# Patient Record
Sex: Female | Born: 1985 | Race: White | Hispanic: No | Smoking: Never smoker
Health system: Southern US, Community
[De-identification: ages and names within clinical notes are randomized; demographics above are authoritative.]

## PROBLEM LIST (undated history)

## (undated) ENCOUNTER — Inpatient Hospital Stay (HOSPITAL_COMMUNITY): Payer: Self-pay

## (undated) DIAGNOSIS — Z789 Other specified health status: Secondary | ICD-10-CM

---
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
## 2010-03-17 ENCOUNTER — Inpatient Hospital Stay (HOSPITAL_COMMUNITY)
Admission: AD | Admit: 2010-03-17 | Discharge: 2010-03-21 | DRG: 371 | Disposition: A | Discharge status: Home / Self Care | Payer: BC Managed Care – PPO | Source: Ambulatory Visit | Attending: Obstetrics and Gynecology | Admitting: Obstetrics and Gynecology

## 2010-03-17 DIAGNOSIS — O4290 Premature rupture of membranes, unspecified as to length of time between rupture and onset of labor, unspecified weeks of gestation: Secondary | ICD-10-CM | POA: Diagnosis present

## 2010-03-17 DIAGNOSIS — O411090 Infection of amniotic sac and membranes, unspecified, unspecified trimester, not applicable or unspecified: Secondary | ICD-10-CM | POA: Diagnosis present

## 2010-03-17 LAB — CBC
Hemoglobin: 13.3 g/dL (ref 12.0–15.0)
MCH: 29.6 pg (ref 26.0–34.0)
RBC: 4.49 MIL/uL (ref 3.87–5.11)
WBC: 9.4 10*3/uL (ref 4.0–10.5)

## 2010-03-18 ENCOUNTER — Other Ambulatory Visit: Payer: Self-pay | Admitting: Obstetrics and Gynecology

## 2010-03-18 LAB — BASIC METABOLIC PANEL
BUN: 4 mg/dL — ABNORMAL LOW (ref 6–23)
CO2: 22 mEq/L (ref 19–32)
Chloride: 102 mEq/L (ref 96–112)
Creatinine, Ser: 0.7 mg/dL (ref 0.4–1.2)
Glucose, Bld: 101 mg/dL — ABNORMAL HIGH (ref 70–99)
Potassium: 4 mEq/L (ref 3.5–5.1)

## 2010-03-18 LAB — DIFFERENTIAL
Eosinophils Absolute: 0 10*3/uL (ref 0.0–0.7)
Lymphocytes Relative: 3 % — ABNORMAL LOW (ref 12–46)
Lymphs Abs: 0.5 10*3/uL — ABNORMAL LOW (ref 0.7–4.0)
Monocytes Relative: 8 % (ref 3–12)
Neutrophils Relative %: 89 % — ABNORMAL HIGH (ref 43–77)

## 2010-03-18 LAB — CBC
HCT: 35.8 % — ABNORMAL LOW (ref 36.0–46.0)
Hemoglobin: 11.9 g/dL — ABNORMAL LOW (ref 12.0–15.0)
MCH: 29.5 pg (ref 26.0–34.0)
MCV: 88.8 fL (ref 78.0–100.0)
Platelets: 154 10*3/uL (ref 150–400)
RBC: 4.03 MIL/uL (ref 3.87–5.11)
WBC: 16.7 10*3/uL — ABNORMAL HIGH (ref 4.0–10.5)

## 2010-03-19 LAB — CBC
HCT: 28.6 % — ABNORMAL LOW (ref 36.0–46.0)
Hemoglobin: 9.5 g/dL — ABNORMAL LOW (ref 12.0–15.0)
MCHC: 33.2 g/dL (ref 30.0–36.0)
MCV: 90.2 fL (ref 78.0–100.0)
RDW: 13.5 % (ref 11.5–15.5)

## 2010-03-19 LAB — DIFFERENTIAL
Basophils Absolute: 0 10*3/uL (ref 0.0–0.1)
Basophils Relative: 0 % (ref 0–1)
Eosinophils Relative: 1 % (ref 0–5)
Monocytes Absolute: 0.9 10*3/uL (ref 0.1–1.0)
Monocytes Relative: 7 % (ref 3–12)
Neutro Abs: 11.1 10*3/uL — ABNORMAL HIGH (ref 1.7–7.7)

## 2010-04-03 NOTE — Discharge Summary (Signed)
[name], LADD               ACCOUNT NO.:  [id number]  MEDICAL RECORD NO.:  [id number]           PATIENT TYPE:  I  LOCATION:  9133                          FACILITY:  [facility]  PHYSICIAN:  Pieter Partridge, MD   DATE OF BIRTH:  Nov 01, 1985  DATE OF ADMISSION:  03/17/2010 DATE OF DISCHARGE:  03/21/2010                              DISCHARGE SUMMARY   ADMITTING DIAGNOSIS:  Pregnancy at 38 weeks with premature rupture of membranes.  PROCEDURES:  Induction of labor, primary C-section on March 18, 2010, IV antibiotics.  DISCHARGE DIAGNOSES:  Status post low transverse cesarean section, chorioamnionitis, and anemia.  HOSPITAL COURSE:  [name] was admitted on March 17, 2010, after she reported spontaneous rupture of membranes spontaneously without contractions on 8 a.m. on the day of discharge.  She was given Pitocin augmentation to induce the labor.  She had a protracted labor course. She had an IUPC was placed and contractions were adequate to subadequate.  However, she arrested on an 8-9 cm and a C-section was performed.  C-section was performed without complications.  Also, at that time, the patient did have prolonged rupture of membranes at 18 hours.  She received 1 dose of a ampicillin 2 g.  At the time the C-section was called approximately 3 hours later, she did have a temperature of 101.2.  She was given antibiotics preop and would continue after delivery.  On March 18, 2010, the patient underwent an uncomplicated primary low transverse cesarean section.  Baby was 8 pounds and 1 ounce and Apgars 8 and 9.  EBL was 700.  ROUTINE PROCEDURE:  Skin was closed with 4-0 Monocryl on a Keith and Dermabond was applied to the incision.  The patient received antibiotics for 24 hours postop.  She never had another fever.  Her white count immediately in recovery was 16.9.  Her postop course was routine.  She was advanced to a regular diet without complication.  Her pain  was controlled with Dilaudid.  She has severe nausea and vomiting with Percocet and codeine-derivative narcotics, so she was given Dilaudid which controlled her pain very well.  She ambulated without difficulty.  She did have constipation at that time. In-house she was given Colace for that.  Her incision was clean, dry, and intact.  Uterine fundus was appropriately tender incision.  On extremity, she had 1 to 2+ edema.  LABORATORY STUDIES:  Her preop hemoglobin was 13.  Postop hemoglobin was 9.5.  White count on March 19, 2010, was 12.7.  DISCHARGE INFORMATION:  The patient is discharged to home in stable condition.  She is to follow up at [facility] in 2weeks for wound check.  DISCHARGE DIET:  Regular.  ACTIVITY:  No heavy lifting and pelvic rest.  MEDICATIONS:  Motrin 800; Dilaudid 2 mg, 20 tablets; and iron and then she is discharged to home in improved condition.  Baby did have a circumcision prior to discharge.     Pieter Partridge, MD     EBV/MEDQ  D:  03/21/2010  T:  03/21/2010  Job:  [id number]  Electronically Signed by Geryl Rankins MD  on 04/03/2010 08:33:43 AM

## 2010-04-03 NOTE — Op Note (Signed)
[name], [name]               ACCOUNT NO.:  [id number]  MEDICAL RECORD NO.:  [id number]           PATIENT TYPE:  I  LOCATION:  9133                          [facility]ACILITY[facility]  [facility]  PHYSICIAN:  Pieter Partridge, MD   DATE OF BIRTH:  1985-04-06  DATE OF PROCEDURE:  03/18/2010 DATE OF DISCHARGE:                              OPERATIVE REPORT   PREOPERATIVE DIAGNOSES: 1. Pregnancy at 38 and 2/7th weeks. 2. Arrest of dilatation. 3. Fever with presumed chorioamnionitis. 4. Prolonged rupture of membranes.  POSTOPERATIVE DIAGNOSES: 1. Pregnancy at 38 and 2/7th weeks. 2. Arrest of dilatation. 3. Fever with presumed chorioamnionitis. 4. Prolonged rupture of membranes.  SURGERY:  Primary low transverse cesarean section.  SURGEON:  Pieter Partridge, MD  ASSISTANT:  Technician.  ANESTHESIA:  Epidural.  ESTIMATED BLOOD LOSS:  700.  URINE OUTPUT:  100 of concentrating clear urine.  IV FLUIDS:  2800.  FINDINGS:  Viable female, no nuchal cord, Apgars 8 and 9, weight is 8 pounds 1 ounce, suspected transverse position.  SPECIMEN:  Placenta.  COMPLICATIONS:  None.  Sent to PACU in stable condition.  INDICATIONS:  [name] is a 25 year old gravida 2, para 0-0-1-0, who presented the day before surgery at 8 o'clock a.m. with premature rupture of membranes.  Cervical exam upon arrival was noted 1-2, 50, and -2.  She was not contracting.  She was started on low-dose Pitocin and progressed in active labor approximately 10 hours later.  An IUPC was placed because of the poor progression.  Pitocin continued to be adjusted, but her labor was protracted.  MVUs initially were less than 180 but once then they came up to 180, the patient continued to slowly progress approximately 1 cm every 1-1/2-2 hours.  The patient finally arrested at 8-9 cm,  0 station, and also at the time of C-section, recorded temperature was 101.  She had received ampicillin 2 g 3 hours prior to her fever.  Due to arrest  of dilatation, the patient was counseled on need for primary low transverse cesarean section.  Consent was obtained.  PROCEDURE IN DETAIL:  [name] was taken to the operating room with IV running.  She had epidural anesthesia in place.  She was prepped and draped in a normal sterile fashion.  After adequate anesthesia was confirmed, a Pfannenstiel skin incision was made 2 cm above the symphysis pubis with the scalpel and carried down to the underlying layer of fascia with the Bovie.  The patient had approximately 3 cm of subcutaneous tissues, subcutaneous adipose.  The fascia was then incised at the midline with the Bovie and the incision was extended laterally with the curved Mayo scissors.  The upper leaf of the fascia was then grasped with the Kocher clamps x2 and the rectus muscles were dissected sharply off the fascia.  Same was done below. The muscles were separated at the midline manually.  Peritoneum was identified and tented up and entered sharply with the Metzenbaum scissors.  A little adhesion was lysed with the Metzenbaum.  Alexis retractor was placed without difficulty.  Lower uterine segment was identified.  Bladder flap  was made, incising the serosa with the Metzenbaum scissors.  A transverse incision was made on the lower uterine segment in the standard laterally with the bandage scissors.  The occiput was delivered easily to the incision.  Baby was delivered without complication.  No nuchal cord noted.  Nose and mouth suctioned, cord clamped x2 and cut. Cord blood obtained.  Baby was placed at the bedside to the awaiting NICU Team.  Placenta was removed manually and the uterus was cleared of all clots and debris.  Uterus was a little bit foggy.  Pitocin and massage was initiated.  We would later call for Methergine.  The hysterotomy incision was reapproximated with 0 Vicryl in a continuous running fashion.  On the right-hand side, it was noted that there was  an extension of the hysterotomy incision that was reapproximated with 0 chromic.  Attention was turned to the portion of the cervix, that was cut back up to the incision and it was not bleeding at all.  Second layer of suture was used for imbrication.  Cauterization as needed with the Bovie.  Surgicel was then applied to the lower uterine segment.  After copious irrigation of the abdominal cavity, the rectus muscles were reapproximated in 2 interrupted stitches.  Fascia was then reapproximated with 0 Vicryl in a continuous running fashion. Subcutaneous space was irrigated and reapproximated with 2-0 plain gut. Skin was reapproximated with 4-0 Vicryl on a Keith needle.  Dermabond was applied to the incision.  The patient tolerated the procedure well.  All instrument, sponge, and needle counts were correct x3.  She was taken to the PACU in stable condition.  She had received ampicillin approximately 3 hours before surgery.  She got an additional gentamicin and clindamycin at the time of surgery.  SCDs were in place.  She also received 650 mg of Tylenol per rectum prior to the procedure.     Pieter Partridge, MD     EBV/MEDQ  D:  03/18/2010  T:  03/18/2010  Job:  [id number]  Electronically Signed by Geryl Rankins MD on 04/03/2010 08:34:14 AM

## 2010-04-03 NOTE — H&P (Signed)
NAMEEILIDH, MARCANO               ACCOUNT NO.:  [id number]  MEDICAL RECORD NO.:  [id number]           PATIENT TYPE:  I  LOCATION:  9133                          [facility]  [facility]  PHYSICIAN:  Pieter Partridge, MD   DATE OF BIRTH:  1985/12/17  DATE OF ADMISSION:  03/17/2010 DATE OF DISCHARGE:                             HISTORY & PHYSICAL   ADMISSION DIAGNOSIS:  Pregnancy at 38+ weeks with premature rupture of membranes.  ANTICIPATED PROCEDURES:  Induction of labor.  [name] is a 25 year old gravida 2, para 0-0-1-0 at 23 weeks and 1 day of admission, estimated due date of March 31, 2010, which was revised by 6-week ultrasound.  On the day of admission at 8 o'clock in the morning, she spontaneously ruptured with clear fluid.  She did not have contractions before but left before ruptured membranes and got a little crampy afterwards.  She did not have any vaginal bleeding and the fetus was active.  She was admitted to labor and delivery after it was noted that she was grossly ruptured, copious clear fluid was noted by the RN. Her prenatal care has been uncomplicated.  Group B strep status is negative.  ALLERGIES:  SHELLFISH and SULFA.  SULFA gives her nausea and vomit. SHELLFISH gives her hives.  MEDICATION:  Metronidazole and prenatal vitamins.  PAST MEDICAL HISTORY:  A remote history of asthma, last attack was at age 25, also history of mitral valve prolapse.  PAST SURGICAL HISTORY:  Negative.  OB HISTORY:  Elective AB at 6 weeks, uncomplicated.  GYN HISTORY:  Positive for Chlamydia and at 19, denied having PID.  No abnormal Pap smears.  FAMILY HISTORY:  Remarkable for her father had lung disease.  Father with ulcers and reflux, and her sister has hypothyroidism which is not an autoimmune etiology.  SOCIAL HISTORY:  Negative for tobacco, alcohol, or drug use.  Her partner is involved.  She is a Sales executive.  PHYSICAL EXAMINATION:  VITAL SIGNS:  Temperature at  this time is 101.0. She originally was 23.6, blood pressure is within normal limits. GENERAL:  No acute distress, alert and oriented. ABDOMEN:  Gravid and nontender. EXTREMITIES: One plus edema.  Cervical exam 8 to 9, moulding.  The presenting vertex is at 0 station.  External monitoring, baby was beautifully reactive upon arrival in the last hour it has decreased reactivity but there are accelerations, and no decelerations. Contractions sinusoidal every 1-2 minutes.  Pitocin was increased up to 28 milliunits.  MVUs have hovered around 180.  LABORATORY STUDIES:  Hemoglobin was 13.  White count on admission was 9.4.  ASSESSMENT:  This is a 25 year old gravida 2, para 0-0-1-0 38-2/7 weeks now who had arrest of dilatation and now fever likely due to chorioamnionitis, prolonged rupture of membranes.  PLAN:  To proceed with primary low transverse C-section.  She received ampicillin 23 hours ago  She will receive gentin, clinda, in the operating room and then also Tylenol per rectum for fever and we will continue antibiotics 24 hours post delivery.  Procedure and indication was discussed with the patient.  Informed consent was obtained.  Pieter Partridge, MD     EBV/MEDQ  D:  03/18/2010  T:  03/18/2010  Job:  [id number]  Electronically Signed by Geryl Rankins MD on 04/03/2010 08:33:52 AM

## 2011-07-16 ENCOUNTER — Other Ambulatory Visit: Payer: Self-pay | Admitting: Obstetrics & Gynecology

## 2011-07-16 ENCOUNTER — Other Ambulatory Visit (HOSPITAL_COMMUNITY)
Admission: RE | Admit: 2011-07-16 | Discharge: 2011-07-16 | Disposition: A | Discharge status: Home / Self Care | Payer: Managed Care, Other (non HMO) | Source: Ambulatory Visit | Attending: Obstetrics & Gynecology | Admitting: Obstetrics & Gynecology

## 2011-07-16 DIAGNOSIS — Z124 Encounter for screening for malignant neoplasm of cervix: Secondary | ICD-10-CM | POA: Insufficient documentation

## 2011-07-16 DIAGNOSIS — Z113 Encounter for screening for infections with a predominantly sexual mode of transmission: Secondary | ICD-10-CM | POA: Insufficient documentation

## 2013-06-23 ENCOUNTER — Telehealth: Payer: Self-pay | Admitting: Genetic Counselor

## 2013-06-23 NOTE — Telephone Encounter (Signed)
CALLED PATIENT TO SCHEDULE GENETIC APPT NOT ABLE TO LEAVE MESSAGE.  °

## 2013-06-26 ENCOUNTER — Telehealth: Payer: Self-pay | Admitting: Genetic Counselor

## 2013-06-26 NOTE — Telephone Encounter (Signed)
called patient to schedule genetic appt not able to leave message

## 2014-02-26 ENCOUNTER — Inpatient Hospital Stay (HOSPITAL_COMMUNITY)
Admission: AD | Admit: 2014-02-26 | Discharge: 2014-02-26 | Disposition: A | Discharge status: Home / Self Care | Payer: BLUE CROSS/BLUE SHIELD | Source: Ambulatory Visit | Attending: Obstetrics and Gynecology | Admitting: Obstetrics and Gynecology

## 2014-02-26 ENCOUNTER — Inpatient Hospital Stay (HOSPITAL_COMMUNITY): Payer: BLUE CROSS/BLUE SHIELD

## 2014-02-26 ENCOUNTER — Encounter (HOSPITAL_COMMUNITY): Payer: Self-pay | Admitting: General Practice

## 2014-02-26 DIAGNOSIS — O200 Threatened abortion: Secondary | ICD-10-CM

## 2014-02-26 DIAGNOSIS — Z3A01 Less than 8 weeks gestation of pregnancy: Secondary | ICD-10-CM | POA: Diagnosis not present

## 2014-02-26 DIAGNOSIS — O209 Hemorrhage in early pregnancy, unspecified: Secondary | ICD-10-CM | POA: Diagnosis present

## 2014-02-26 HISTORY — DX: Other specified health status: Z78.9

## 2014-02-26 LAB — WET PREP, GENITAL
Clue Cells Wet Prep HPF POC: NONE SEEN
Trich, Wet Prep: NONE SEEN
YEAST WET PREP: NONE SEEN

## 2014-02-26 LAB — URINALYSIS, ROUTINE W REFLEX MICROSCOPIC
BILIRUBIN URINE: NEGATIVE
GLUCOSE, UA: NEGATIVE mg/dL
KETONES UR: NEGATIVE mg/dL
LEUKOCYTES UA: NEGATIVE
Nitrite: NEGATIVE
PH: 7 (ref 5.0–8.0)
PROTEIN: NEGATIVE mg/dL
SPECIFIC GRAVITY, URINE: 1.015 (ref 1.005–1.030)
UROBILINOGEN UA: 0.2 mg/dL (ref 0.0–1.0)

## 2014-02-26 LAB — CBC WITH DIFFERENTIAL/PLATELET
BASOS ABS: 0 10*3/uL (ref 0.0–0.1)
Basophils Relative: 0 % (ref 0–1)
EOS ABS: 0 10*3/uL (ref 0.0–0.7)
EOS PCT: 0 % (ref 0–5)
HCT: 39 % (ref 36.0–46.0)
Hemoglobin: 13.5 g/dL (ref 12.0–15.0)
LYMPHS ABS: 1.3 10*3/uL (ref 0.7–4.0)
LYMPHS PCT: 15 % (ref 12–46)
MCH: 30.4 pg (ref 26.0–34.0)
MCHC: 34.6 g/dL (ref 30.0–36.0)
MCV: 87.8 fL (ref 78.0–100.0)
Monocytes Absolute: 0.5 10*3/uL (ref 0.1–1.0)
Monocytes Relative: 5 % (ref 3–12)
Neutro Abs: 7.1 10*3/uL (ref 1.7–7.7)
Neutrophils Relative %: 80 % — ABNORMAL HIGH (ref 43–77)
PLATELETS: 231 10*3/uL (ref 150–400)
RBC: 4.44 MIL/uL (ref 3.87–5.11)
RDW: 12.3 % (ref 11.5–15.5)
WBC: 8.9 10*3/uL (ref 4.0–10.5)

## 2014-02-26 LAB — URINE MICROSCOPIC-ADD ON: WBC UA: NONE SEEN WBC/hpf (ref <3)

## 2014-02-26 LAB — HCG, QUANTITATIVE, PREGNANCY: hCG, Beta Chain, Quant, S: 29001 m[IU]/mL — ABNORMAL HIGH (ref <5)

## 2014-02-26 LAB — POCT PREGNANCY, URINE: Preg Test, Ur: POSITIVE — AB

## 2014-02-26 NOTE — MAU Note (Signed)
Has not been seen yet with preg, has first appt scheduled

## 2014-02-26 NOTE — MAU Provider Note (Signed)
History     CSN: [id number]  Arrival date and time: 02/26/14 1156   First Provider Initiated Contact with Patient 02/26/14 1337      Chief Complaint  Patient presents with  . Vaginal Bleeding   HPI [name] is 29 y.o. G2P0101 [redacted]w[redacted]d weeks presenting with vaginal bleeding in first trimester pregnancy.  She is a patient of Dr. Dion Body, who delivered her first baby.  She has appt Thursday.  LMP 01/15/14.  She began bleeding morning that followed a mild cramp.  "watery blood at first, then it stopped.  30 minutes later bleeding again dark red".  Denies cramping since.  Had intercourse last night.   Past Medical History  Diagnosis Date  . Medical history non-contributory     Past Surgical History  Procedure Laterality Date  . Cesarean section      History reviewed. No pertinent family history.  History  Substance Use Topics  . Smoking status: Never Smoker   . Smokeless tobacco: Never Used  . Alcohol Use: No     Comment: socially before pregnancy    Allergies:  Allergies  Allergen Reactions  . Shellfish Allergy Hives  . Sulfa Antibiotics Hives    Prescriptions prior to admission  Medication Sig Dispense Refill Last Dose  . Prenatal Vit-Fe Fumarate-FA (PRENATAL MULTIVITAMIN) TABS tablet Take 1 tablet by mouth daily at 12 noon.   02/25/2014 at Unknown time    Review of Systems  Constitutional: Negative for fever and chills.  Gastrointestinal: Negative for nausea, vomiting and abdominal pain.  Genitourinary: Negative for dysuria, urgency, frequency and hematuria.       + vaginal bleeding  Neurological: Negative for headaches.   Physical Exam   Blood pressure 132/90, pulse 85, temperature 98.2 F (36.8 C), temperature source Oral, resp. rate 18, height 5' 1.5" (1.562 m), weight 170 lb (77.111 kg), last menstrual period 01/15/2014.  Physical Exam  Constitutional: She appears well-developed and well-nourished. No distress.  HENT:  Head: Normocephalic.  Neck:  Normal range of motion.  Cardiovascular: Normal rate.   Respiratory: Effort normal.  GI: Soft. She exhibits no distension and no mass. There is no tenderness. There is no rebound and no guarding.  Genitourinary: There is no rash, tenderness or lesion on the right labia. There is no rash, tenderness or lesion on the left labia. Uterus is not tender. Cervix exhibits no motion tenderness, no discharge and no friability. Right adnexum displays no mass, no tenderness and no fullness. Left adnexum displays no mass, no tenderness and no fullness. There is bleeding (small amount of dark stringy blood without clot. ) in the vagina. No erythema in the vagina. No vaginal discharge found.  Skin: Skin is warm and dry.  Psychiatric: She has a normal mood and affect. Her behavior is normal.   Results for orders placed or performed during the hospital encounter of 02/26/14 (from the past 24 hour(s))  Urinalysis, Routine w reflex microscopic     Status: Abnormal   Collection Time: 02/26/14 12:15 PM  Result Value Ref Range   Color, Urine YELLOW YELLOW   APPearance CLEAR CLEAR   Specific Gravity, Urine 1.015 1.005 - 1.030   pH 7.0 5.0 - 8.0   Glucose, UA NEGATIVE NEGATIVE mg/dL   Hgb urine dipstick LARGE (A) NEGATIVE   Bilirubin Urine NEGATIVE NEGATIVE   Ketones, ur NEGATIVE NEGATIVE mg/dL   Protein, ur NEGATIVE NEGATIVE mg/dL   Urobilinogen, UA 0.2 0.0 - 1.0 mg/dL   Nitrite NEGATIVE NEGATIVE  Leukocytes, UA NEGATIVE NEGATIVE  Urine microscopic-add on     Status: Abnormal   Collection Time: 02/26/14 12:15 PM  Result Value Ref Range   Squamous Epithelial / LPF RARE RARE   WBC, UA  <3 WBC/hpf    NO FORMED ELEMENTS SEEN ON URINE MICROSCOPIC EXAMINATION   RBC / HPF 7-10 <3 RBC/hpf   Bacteria, UA FEW (A) RARE   Urine-Other MUCOUS PRESENT   Pregnancy, urine POC     Status: Abnormal   Collection Time: 02/26/14 12:32 PM  Result Value Ref Range   Preg Test, Ur POSITIVE (A) NEGATIVE  CBC with Differential      Status: Abnormal   Collection Time: 02/26/14  2:12 PM  Result Value Ref Range   WBC 8.9 4.0 - 10.5 K/uL   RBC 4.44 3.87 - 5.11 MIL/uL   Hemoglobin 13.5 12.0 - 15.0 g/dL   HCT 16.1 09.6 - 04.5 %   MCV 87.8 78.0 - 100.0 fL   MCH 30.4 26.0 - 34.0 pg   MCHC 34.6 30.0 - 36.0 g/dL   RDW 40.9 81.1 - 91.4 %   Platelets 231 150 - 400 K/uL   Neutrophils Relative % 80 (H) 43 - 77 %   Neutro Abs 7.1 1.7 - 7.7 K/uL   Lymphocytes Relative 15 12 - 46 %   Lymphs Abs 1.3 0.7 - 4.0 K/uL   Monocytes Relative 5 3 - 12 %   Monocytes Absolute 0.5 0.1 - 1.0 K/uL   Eosinophils Relative 0 0 - 5 %   Eosinophils Absolute 0.0 0.0 - 0.7 K/uL   Basophils Relative 0 0 - 1 %   Basophils Absolute 0.0 0.0 - 0.1 K/uL  hCG, quantitative, pregnancy     Status: Abnormal   Collection Time: 02/26/14  2:12 PM  Result Value Ref Range   hCG, Beta Chain, Quant, S 29001 (H) <5 mIU/mL  Wet prep, genital     Status: Abnormal   Collection Time: 02/26/14  3:39 PM  Result Value Ref Range   Yeast Wet Prep HPF POC NONE SEEN NONE SEEN   Trich, Wet Prep NONE SEEN NONE SEEN   Clue Cells Wet Prep HPF POC NONE SEEN NONE SEEN   WBC, Wet Prep HPF POC FEW (A) NONE SEEN   BLOOD TYPE FROM PREVIOUS RECORD IS B POSITIVE      CLINICAL DATA: Bleeding and cramping  EXAM: OBSTETRIC <14 WK [place] AND TRANSVAGINAL OB US  TECHNIQUE: Both transabdominal and transvaginal ultrasound examinations were performed for complete evaluation of the gestation as well as the maternal uterus, adnexal regions, and pelvic cul-de-sac. Transvaginal technique was performed to assess early pregnancy.  COMPARISON: None.  FINDINGS: Intrauterine gestational sac: Single  Yolk sac: Yes  Embryo: Yes  Cardiac Activity: No  Heart Rate: Not visualized bpm  CRL: 3.2 mm 6 w 0 d [place] EDC: 10/22/2014  Maternal uterus/adnexae:  Subchorionic hemorrhage: Small  Right ovary: Normal  Left ovary: Normal  Other  :None  Free fluid: None  IMPRESSION: 1. Single intrauterine gestational sac containing an embryo and yolk sac. No cardiac activity identified within the embryo. Findings are suspicious but not yet definitive for failed pregnancy. Recommend follow-up US in 10-14 days for definitive diagnosis. This recommendation follows SRU consensus guidelines: Diagnostic Criteria for Nonviable Pregnancy Early in the First Trimester. Malva Limes Med 2013; 782:9562-13.   Electronically Signed  By: Signa Kell M.D.  On: 02/26/2014 15:29       MAU Course  Procedures  GC/CHL culture pending  MDM Discussed patients HPI, labs and U/S findings with Dr. Richardson Doppole.  Instructed to tell patient to keep appt for this week in the office and they will arrange to repeat U/S in 7-10 days. Discussed threatened miscarriage with the patient  Assessment and Plan  A:  Vaginal bleeding in first trimester pregancy       Threatened miscarriage  P:  Discussed findings with the patient      Instructed her to call Dr. Levora AngelVanardo with worsening sxs      Keep scheduled appointment  Gerda Yin,EVE M 02/26/2014, 4:14 PM

## 2014-02-26 NOTE — Discharge Instructions (Signed)
Threatened Miscarriage °A threatened miscarriage occurs when you have vaginal bleeding during your first 20 weeks of pregnancy but the pregnancy has not ended. If you have vaginal bleeding during this time, your health care provider will do tests to make sure you are still pregnant. If the tests show you are still pregnant and the developing baby (fetus) inside your womb (uterus) is still growing, your condition is considered a threatened miscarriage. °A threatened miscarriage does not mean your pregnancy will end, but it does increase the risk of losing your pregnancy (complete miscarriage). °CAUSES  °The cause of a threatened miscarriage is usually not known. If you go on to have a complete miscarriage, the most common cause is an abnormal number of chromosomes in the developing baby. Chromosomes are the structures inside cells that hold all your genetic material. °Some causes of vaginal bleeding that do not result in miscarriage include: °· Having sex. °· Having an infection. °· Normal hormone changes of pregnancy. °· Bleeding that occurs when an egg implants in your uterus. °RISK FACTORS °Risk factors for bleeding in early pregnancy include: °· Obesity. °· Smoking. °· Drinking excessive amounts of alcohol or caffeine. °· Recreational drug use. °SIGNS AND SYMPTOMS °· Light vaginal bleeding. °· Mild abdominal pain or cramps. °DIAGNOSIS  °If you have bleeding with or without abdominal pain before 20 weeks of pregnancy, your health care provider will do tests to check whether you are still pregnant. One important test involves using sound waves and a computer (ultrasound) to create images of the inside of your uterus. Other tests include an internal exam of your vagina and uterus (pelvic exam) and measurement of your baby's heart rate.  °You may be diagnosed with a threatened miscarriage if: °· Ultrasound testing shows you are still pregnant. °· Your baby's heart rate is strong. °· A pelvic exam shows that the  opening between your uterus and your vagina (cervix) is closed. °· Your heart rate and blood pressure are stable. °· Blood tests confirm you are still pregnant. °TREATMENT  °No treatments have been shown to prevent a threatened miscarriage from going on to a complete miscarriage. However, the right home care is important.  °HOME CARE INSTRUCTIONS  °· Make sure you keep all your appointments for prenatal care. This is very important. °· Get plenty of rest. °· Do not have sex or use tampons if you have vaginal bleeding. °· Do not douche. °· Do not smoke or use recreational drugs. °· Do not drink alcohol. °· Avoid caffeine. °SEEK MEDICAL CARE IF: °· You have light vaginal bleeding or spotting while pregnant. °· You have abdominal pain or cramping. °· You have a fever. °SEEK IMMEDIATE MEDICAL CARE IF: °· You have heavy vaginal bleeding. °· You have blood clots coming from your vagina. °· You have severe low back pain or abdominal cramps. °· You have fever, chills, and severe abdominal pain. °MAKE SURE YOU: °· Understand these instructions. °· Will watch your condition. °· Will get help right away if you are not doing well or get worse. °Document Released: 01/26/2005 Document Revised: 01/31/2013 Document Reviewed: 11/22/2012 °ExitCare® Patient Information ©2015 [employer]. This information is not intended to replace advice given to you by your health care provider. Make sure you discuss any questions you have with your health care provider. ° °Vaginal Bleeding During Pregnancy, First Trimester °A small amount of bleeding (spotting) from the vagina is relatively common in early pregnancy. It usually stops on its own. Various things may cause bleeding   or spotting in early pregnancy. Some bleeding may be related to the pregnancy, and some may not. In most cases, the bleeding is normal and is not a problem. However, bleeding can also be a sign of something serious. Be sure to tell your health care provider about any  vaginal bleeding right away. °Some possible causes of vaginal bleeding during the first trimester include: °· Infection or inflammation of the cervix. °· Growths (polyps) on the cervix. °· Miscarriage or threatened miscarriage. °· Pregnancy tissue has developed outside of the uterus and in a fallopian tube (tubal pregnancy). °· Tiny cysts have developed in the uterus instead of pregnancy tissue (molar pregnancy). °HOME CARE INSTRUCTIONS  °Watch your condition for any changes. The following actions may help to lessen any discomfort you are feeling: °· Follow your health care provider's instructions for limiting your activity. If your health care provider orders bed rest, you may need to stay in bed and only get up to use the bathroom. However, your health care provider may allow you to continue light activity. °· If needed, make plans for someone to help with your regular activities and responsibilities while you are on bed rest. °· Keep track of the number of pads you use each day, how often you change pads, and how soaked (saturated) they are. Write this down. °· Do not use tampons. Do not douche. °· Do not have sexual intercourse or orgasms until approved by your health care provider. °· If you pass any tissue from your vagina, save the tissue so you can show it to your health care provider. °· Only take over-the-counter or prescription medicines as directed by your health care provider. °· Do not take aspirin because it can make you bleed. °· Keep all follow-up appointments as directed by your health care provider. °SEEK MEDICAL CARE IF: °· You have any vaginal bleeding during any part of your pregnancy. °· You have cramps or labor pains. °· You have a fever, not controlled by medicine. °SEEK IMMEDIATE MEDICAL CARE IF:  °· You have severe cramps in your back or belly (abdomen). °· You pass large clots or tissue from your vagina. °· Your bleeding increases. °· You feel light-headed or weak, or you have fainting  episodes. °· You have chills. °· You are leaking fluid or have a gush of fluid from your vagina. °· You pass out while having a bowel movement. °MAKE SURE YOU: °· Understand these instructions. °· Will watch your condition. °· Will get help right away if you are not doing well or get worse. °Document Released: 11/05/2004 Document Revised: 01/31/2013 Document Reviewed: 10/03/2012 °ExitCare® Patient Information ©2015 [employer]. This information is not intended to replace advice given to you by your health care provider. Make sure you discuss any questions you have with your health care provider. ° °

## 2014-02-26 NOTE — MAU Note (Signed)
Started bleeding around 1030 this morninig.  Is bleeding more now, but cramping less.

## 2014-02-27 LAB — GC/CHLAMYDIA PROBE AMP (~~LOC~~) NOT AT ARMC
CHLAMYDIA, DNA PROBE: NEGATIVE
NEISSERIA GONORRHEA: NEGATIVE

## 2014-03-29 ENCOUNTER — Other Ambulatory Visit: Payer: Self-pay | Admitting: Obstetrics and Gynecology

## 2014-03-29 ENCOUNTER — Other Ambulatory Visit (HOSPITAL_COMMUNITY)
Admission: RE | Admit: 2014-03-29 | Discharge: 2014-03-29 | Disposition: A | Discharge status: Home / Self Care | Payer: BLUE CROSS/BLUE SHIELD | Source: Ambulatory Visit | Attending: Obstetrics and Gynecology | Admitting: Obstetrics and Gynecology

## 2014-03-29 DIAGNOSIS — Z01411 Encounter for gynecological examination (general) (routine) with abnormal findings: Secondary | ICD-10-CM | POA: Diagnosis present

## 2014-03-29 DIAGNOSIS — Z1151 Encounter for screening for human papillomavirus (HPV): Secondary | ICD-10-CM | POA: Diagnosis present

## 2014-03-29 DIAGNOSIS — R87810 Cervical high risk human papillomavirus (HPV) DNA test positive: Secondary | ICD-10-CM | POA: Insufficient documentation

## 2014-04-02 LAB — CYTOLOGY - PAP

## 2014-05-22 ENCOUNTER — Encounter (HOSPITAL_COMMUNITY): Payer: Self-pay

## 2014-10-12 ENCOUNTER — Encounter (HOSPITAL_COMMUNITY)
Admission: RE | Admit: 2014-10-12 | Discharge: 2014-10-12 | Disposition: A | Discharge status: Home / Self Care | Payer: Medicaid Other | Source: Ambulatory Visit | Attending: Obstetrics and Gynecology | Admitting: Obstetrics and Gynecology

## 2014-10-12 ENCOUNTER — Encounter (HOSPITAL_COMMUNITY): Payer: Self-pay

## 2014-10-12 DIAGNOSIS — O3421 Maternal care for scar from previous cesarean delivery: Secondary | ICD-10-CM | POA: Diagnosis not present

## 2014-10-12 DIAGNOSIS — Z01818 Encounter for other preprocedural examination: Secondary | ICD-10-CM | POA: Diagnosis not present

## 2014-10-12 LAB — TYPE AND SCREEN
ABO/RH(D): B POS
Antibody Screen: NEGATIVE

## 2014-10-12 LAB — CBC
HCT: 31.8 % — ABNORMAL LOW (ref 36.0–46.0)
HEMOGLOBIN: 10.7 g/dL — AB (ref 12.0–15.0)
MCH: 29.8 pg (ref 26.0–34.0)
MCHC: 33.6 g/dL (ref 30.0–36.0)
MCV: 88.6 fL (ref 78.0–100.0)
PLATELETS: 143 10*3/uL — AB (ref 150–400)
RBC: 3.59 MIL/uL — AB (ref 3.87–5.11)
RDW: 13.5 % (ref 11.5–15.5)
WBC: 7.3 10*3/uL (ref 4.0–10.5)

## 2014-10-12 NOTE — Patient Instructions (Addendum)
   Your procedure is scheduled on: SEPT 6 AT 730AM   [facility] through [facility]  Pick up the phone at the desk and dial [phone] and inform us of your arrival.  Please call this number if you have any problems the morning of surgery: [phone]  Remember: DO NOT EAT OR DRINK LIQUIDS AFTER MIDNIGHT SEPT 5 (MONDAY)     Do not wear jewelry, make-up, or FINGER nail polish No metal in your hair or on your body. Do not wear lotions, powders, perfumes.  You may wear deodorant.  Do not bring valuables to the hospital. Contacts, dentures or bridgework may not be worn into surgery.  Leave suitcase in the car. After Surgery it may be brought to your room. For patients being admitted to the hospital, checkout time is 11:00am the day of discharge.

## 2014-10-13 LAB — RPR: RPR Ser Ql: NONREACTIVE

## 2014-10-16 ENCOUNTER — Inpatient Hospital Stay (HOSPITAL_COMMUNITY)
Admission: RE | Admit: 2014-10-16 | Discharge: 2014-10-18 | DRG: 766 | Disposition: A | Discharge status: Home / Self Care | Payer: 59 | Source: Ambulatory Visit | Attending: Obstetrics and Gynecology | Admitting: Obstetrics and Gynecology

## 2014-10-16 ENCOUNTER — Encounter (HOSPITAL_COMMUNITY): Payer: Self-pay

## 2014-10-16 ENCOUNTER — Inpatient Hospital Stay (HOSPITAL_COMMUNITY): Payer: 59 | Admitting: Certified Registered Nurse Anesthetist

## 2014-10-16 ENCOUNTER — Encounter (HOSPITAL_COMMUNITY)
Admission: AD | Disposition: A | Discharge status: Home / Self Care | Payer: Self-pay | Source: Ambulatory Visit | Attending: Obstetrics and Gynecology

## 2014-10-16 DIAGNOSIS — Z720 Tobacco use: Secondary | ICD-10-CM

## 2014-10-16 DIAGNOSIS — Z3A39 39 weeks gestation of pregnancy: Secondary | ICD-10-CM | POA: Diagnosis present

## 2014-10-16 DIAGNOSIS — Z98891 History of uterine scar from previous surgery: Secondary | ICD-10-CM

## 2014-10-16 DIAGNOSIS — O3421 Maternal care for scar from previous cesarean delivery: Principal | ICD-10-CM | POA: Diagnosis present

## 2014-10-16 LAB — TYPE AND SCREEN
ABO/RH(D): B POS
Antibody Screen: NEGATIVE

## 2014-10-16 SURGERY — Surgical Case
Anesthesia: Spinal

## 2014-10-16 MED ORDER — MEPERIDINE HCL 25 MG/ML IJ SOLN: 6.2500 mg | INTRAMUSCULAR | Status: DC | PRN

## 2014-10-16 MED ORDER — CEFAZOLIN SODIUM-DEXTROSE 2-3 GM-% IV SOLR
2.0000 g | INTRAVENOUS | Status: AC
  Administered 2014-10-16: 2 g via INTRAVENOUS

## 2014-10-16 MED ORDER — LACTATED RINGERS IV SOLN
INTRAVENOUS | Status: DC
  Administered 2014-10-16 (×2): via INTRAVENOUS

## 2014-10-16 MED ORDER — HYDROMORPHONE HCL 1 MG/ML IJ SOLN: 0.2500 mg | INTRAMUSCULAR | Status: DC | PRN

## 2014-10-16 MED ORDER — SCOPOLAMINE 1 MG/3DAYS TD PT72: 1.0000 | MEDICATED_PATCH | Freq: Once | TRANSDERMAL | Status: DC

## 2014-10-16 MED ORDER — DIPHENHYDRAMINE HCL 25 MG PO CAPS: 25.0000 mg | ORAL_CAPSULE | Freq: Four times a day (QID) | ORAL | Status: DC | PRN

## 2014-10-16 MED ORDER — ONDANSETRON HCL 4 MG/2ML IJ SOLN: 4.0000 mg | Freq: Three times a day (TID) | INTRAMUSCULAR | Status: DC | PRN

## 2014-10-16 MED ORDER — DIPHENHYDRAMINE HCL 50 MG/ML IJ SOLN
12.5000 mg | INTRAMUSCULAR | Status: DC | PRN
  Administered 2014-10-16: 12.5 mg via INTRAVENOUS
  Filled 2014-10-16: qty 1

## 2014-10-16 MED ORDER — LACTATED RINGERS IV SOLN: INTRAVENOUS | Status: DC

## 2014-10-16 MED ORDER — IBUPROFEN 600 MG PO TABS
600.0000 mg | ORAL_TABLET | Freq: Four times a day (QID) | ORAL | Status: DC
  Administered 2014-10-16 – 2014-10-18 (×8): 600 mg via ORAL
  Filled 2014-10-16 (×8): qty 1

## 2014-10-16 MED ORDER — ZOLPIDEM TARTRATE 5 MG PO TABS: 5.0000 mg | ORAL_TABLET | Freq: Every evening | ORAL | Status: DC | PRN

## 2014-10-16 MED ORDER — NALBUPHINE HCL 10 MG/ML IJ SOLN
5.0000 mg | Freq: Once | INTRAMUSCULAR | Status: DC | PRN
  Filled 2014-10-16: qty 0.5

## 2014-10-16 MED ORDER — PHENYLEPHRINE 8 MG IN D5W 100 ML (0.08MG/ML) PREMIX OPTIME
INJECTION | INTRAVENOUS | Status: AC
  Filled 2014-10-16: qty 100

## 2014-10-16 MED ORDER — NALOXONE HCL 1 MG/ML IJ SOLN
1.0000 ug/kg/h | INTRAVENOUS | Status: DC | PRN
  Filled 2014-10-16: qty 2

## 2014-10-16 MED ORDER — METHYLERGONOVINE MALEATE 0.2 MG PO TABS: 0.2000 mg | ORAL_TABLET | ORAL | Status: DC | PRN

## 2014-10-16 MED ORDER — INFLUENZA VAC SPLIT QUAD 0.5 ML IM SUSY
0.5000 mL | PREFILLED_SYRINGE | INTRAMUSCULAR | Status: AC
  Administered 2014-10-17: 0.5 mL via INTRAMUSCULAR

## 2014-10-16 MED ORDER — NALOXONE HCL 0.4 MG/ML IJ SOLN: 0.4000 mg | INTRAMUSCULAR | Status: DC | PRN

## 2014-10-16 MED ORDER — LACTATED RINGERS IV SOLN
INTRAVENOUS | Status: DC | PRN
  Administered 2014-10-16 (×3): via INTRAVENOUS

## 2014-10-16 MED ORDER — PROMETHAZINE HCL 25 MG/ML IJ SOLN: 6.2500 mg | INTRAMUSCULAR | Status: DC | PRN

## 2014-10-16 MED ORDER — DIBUCAINE 1 % RE OINT: 1.0000 "application " | TOPICAL_OINTMENT | RECTAL | Status: DC | PRN

## 2014-10-16 MED ORDER — ONDANSETRON HCL 4 MG/2ML IJ SOLN
INTRAMUSCULAR | Status: AC
  Filled 2014-10-16: qty 2

## 2014-10-16 MED ORDER — FENTANYL CITRATE (PF) 100 MCG/2ML IJ SOLN
INTRAMUSCULAR | Status: DC | PRN
  Administered 2014-10-16: 10 ug via INTRATHECAL

## 2014-10-16 MED ORDER — OXYTOCIN 10 UNIT/ML IJ SOLN
INTRAMUSCULAR | Status: AC
  Filled 2014-10-16: qty 4

## 2014-10-16 MED ORDER — MENTHOL 3 MG MT LOZG: 1.0000 | LOZENGE | OROMUCOSAL | Status: DC | PRN

## 2014-10-16 MED ORDER — LANOLIN HYDROUS EX OINT: 1.0000 "application " | TOPICAL_OINTMENT | CUTANEOUS | Status: DC | PRN

## 2014-10-16 MED ORDER — OXYTOCIN 40 UNITS IN LACTATED RINGERS INFUSION - SIMPLE MED
62.5000 mL/h | INTRAVENOUS | Status: AC
Start: 2014-10-16 — End: 2014-10-17

## 2014-10-16 MED ORDER — DEXAMETHASONE SODIUM PHOSPHATE 4 MG/ML IJ SOLN
INTRAMUSCULAR | Status: AC
  Filled 2014-10-16: qty 1

## 2014-10-16 MED ORDER — MORPHINE SULFATE 0.5 MG/ML IJ SOLN
INTRAMUSCULAR | Status: AC
  Filled 2014-10-16: qty 100

## 2014-10-16 MED ORDER — BUPIVACAINE IN DEXTROSE 0.75-8.25 % IT SOLN
INTRATHECAL | Status: DC | PRN
  Administered 2014-10-16: 1.4 mL via INTRATHECAL

## 2014-10-16 MED ORDER — METHYLERGONOVINE MALEATE 0.2 MG/ML IJ SOLN: 0.2000 mg | INTRAMUSCULAR | Status: DC | PRN

## 2014-10-16 MED ORDER — OXYTOCIN 10 UNIT/ML IJ SOLN
40.0000 [IU] | INTRAVENOUS | Status: DC | PRN
  Administered 2014-10-16: 40 [IU] via INTRAVENOUS

## 2014-10-16 MED ORDER — SIMETHICONE 80 MG PO CHEW
80.0000 mg | CHEWABLE_TABLET | ORAL | Status: DC
  Administered 2014-10-16 – 2014-10-17 (×2): 80 mg via ORAL
  Filled 2014-10-16 (×2): qty 1

## 2014-10-16 MED ORDER — FENTANYL CITRATE (PF) 100 MCG/2ML IJ SOLN
INTRAMUSCULAR | Status: AC
  Filled 2014-10-16: qty 4

## 2014-10-16 MED ORDER — SIMETHICONE 80 MG PO CHEW: 80.0000 mg | CHEWABLE_TABLET | ORAL | Status: DC | PRN

## 2014-10-16 MED ORDER — KETOROLAC TROMETHAMINE 30 MG/ML IJ SOLN: 30.0000 mg | Freq: Four times a day (QID) | INTRAMUSCULAR | Status: AC | PRN

## 2014-10-16 MED ORDER — NALBUPHINE HCL 10 MG/ML IJ SOLN
5.0000 mg | INTRAMUSCULAR | Status: DC | PRN
  Filled 2014-10-16: qty 0.5

## 2014-10-16 MED ORDER — LACTATED RINGERS IV SOLN
Freq: Once | INTRAVENOUS | Status: AC
  Administered 2014-10-16: 07:00:00 via INTRAVENOUS

## 2014-10-16 MED ORDER — MEPERIDINE HCL 25 MG/ML IJ SOLN
INTRAMUSCULAR | Status: AC
  Filled 2014-10-16: qty 1

## 2014-10-16 MED ORDER — LACTATED RINGERS IV SOLN
INTRAVENOUS | Status: DC | PRN
  Administered 2014-10-16: 08:00:00 via INTRAVENOUS

## 2014-10-16 MED ORDER — MORPHINE SULFATE (PF) 0.5 MG/ML IJ SOLN
INTRAMUSCULAR | Status: DC | PRN
  Administered 2014-10-16: .2 mg via INTRATHECAL

## 2014-10-16 MED ORDER — SCOPOLAMINE 1 MG/3DAYS TD PT72
MEDICATED_PATCH | TRANSDERMAL | Status: AC
  Filled 2014-10-16: qty 1

## 2014-10-16 MED ORDER — OXYCODONE-ACETAMINOPHEN 5-325 MG PO TABS: 1.0000 | ORAL_TABLET | ORAL | Status: DC | PRN

## 2014-10-16 MED ORDER — ACETAMINOPHEN 325 MG PO TABS
650.0000 mg | ORAL_TABLET | ORAL | Status: DC | PRN
  Administered 2014-10-17 – 2014-10-18 (×4): 650 mg via ORAL
  Filled 2014-10-16 (×4): qty 2

## 2014-10-16 MED ORDER — CEFAZOLIN SODIUM-DEXTROSE 2-3 GM-% IV SOLR
INTRAVENOUS | Status: AC
  Filled 2014-10-16: qty 50

## 2014-10-16 MED ORDER — DEXAMETHASONE SODIUM PHOSPHATE 4 MG/ML IJ SOLN
INTRAMUSCULAR | Status: DC | PRN
  Administered 2014-10-16: 4 mg via INTRAVENOUS

## 2014-10-16 MED ORDER — DIPHENHYDRAMINE HCL 25 MG PO CAPS
25.0000 mg | ORAL_CAPSULE | ORAL | Status: DC | PRN
  Administered 2014-10-17: 25 mg via ORAL
  Filled 2014-10-16 (×2): qty 1

## 2014-10-16 MED ORDER — OXYCODONE-ACETAMINOPHEN 5-325 MG PO TABS: 2.0000 | ORAL_TABLET | ORAL | Status: DC | PRN

## 2014-10-16 MED ORDER — TETANUS-DIPHTH-ACELL PERTUSSIS 5-2.5-18.5 LF-MCG/0.5 IM SUSP
0.5000 mL | Freq: Once | INTRAMUSCULAR | Status: AC
  Administered 2014-10-17: 0.5 mL via INTRAMUSCULAR
  Filled 2014-10-16: qty 0.5

## 2014-10-16 MED ORDER — SCOPOLAMINE 1 MG/3DAYS TD PT72
1.0000 | MEDICATED_PATCH | Freq: Once | TRANSDERMAL | Status: DC
  Administered 2014-10-16: 1.5 mg via TRANSDERMAL

## 2014-10-16 MED ORDER — ONDANSETRON HCL 4 MG/2ML IJ SOLN
INTRAMUSCULAR | Status: DC | PRN
  Administered 2014-10-16: 4 mg via INTRAVENOUS

## 2014-10-16 MED ORDER — SIMETHICONE 80 MG PO CHEW
80.0000 mg | CHEWABLE_TABLET | Freq: Three times a day (TID) | ORAL | Status: DC
  Administered 2014-10-16 – 2014-10-18 (×4): 80 mg via ORAL
  Filled 2014-10-16 (×5): qty 1

## 2014-10-16 MED ORDER — PRENATAL MULTIVITAMIN CH
1.0000 | ORAL_TABLET | Freq: Every day | ORAL | Status: DC
  Administered 2014-10-17 – 2014-10-18 (×2): 1 via ORAL
  Filled 2014-10-16 (×2): qty 1

## 2014-10-16 MED ORDER — SODIUM CHLORIDE 0.9 % IJ SOLN: 3.0000 mL | INTRAMUSCULAR | Status: DC | PRN

## 2014-10-16 MED ORDER — ACETAMINOPHEN 500 MG PO TABS
1000.0000 mg | ORAL_TABLET | Freq: Four times a day (QID) | ORAL | Status: AC
  Administered 2014-10-16 – 2014-10-17 (×4): 1000 mg via ORAL
  Filled 2014-10-16 (×4): qty 2

## 2014-10-16 MED ORDER — SENNOSIDES-DOCUSATE SODIUM 8.6-50 MG PO TABS
2.0000 | ORAL_TABLET | ORAL | Status: DC
  Administered 2014-10-16 – 2014-10-17 (×2): 2 via ORAL
  Filled 2014-10-16 (×2): qty 2

## 2014-10-16 MED ORDER — PHENYLEPHRINE 8 MG IN D5W 100 ML (0.08MG/ML) PREMIX OPTIME
INJECTION | INTRAVENOUS | Status: DC | PRN
  Administered 2014-10-16: 60 ug/min via INTRAVENOUS

## 2014-10-16 MED ORDER — WITCH HAZEL-GLYCERIN EX PADS: 1.0000 "application " | MEDICATED_PAD | CUTANEOUS | Status: DC | PRN

## 2014-10-16 SURGICAL SUPPLY — 39 items
BARRIER ADHS 3X4 INTERCEED (GAUZE/BANDAGES/DRESSINGS) ×6 IMPLANT
BENZOIN TINCTURE PRP APPL 2/3 (GAUZE/BANDAGES/DRESSINGS) ×3 IMPLANT
CLAMP CORD UMBIL (MISCELLANEOUS) IMPLANT
CLOSURE WOUND 1/2 X4 (GAUZE/BANDAGES/DRESSINGS) ×1
CLOTH BEACON ORANGE TIMEOUT ST (SAFETY) ×3 IMPLANT
DRAPE SHEET LG 3/4 BI-LAMINATE (DRAPES) IMPLANT
DRSG OPSITE POSTOP 4X10 (GAUZE/BANDAGES/DRESSINGS) ×3 IMPLANT
DURAPREP 26ML APPLICATOR (WOUND CARE) ×3 IMPLANT
ELECT REM PT RETURN 9FT ADLT (ELECTROSURGICAL) ×3
ELECTRODE REM PT RTRN 9FT ADLT (ELECTROSURGICAL) ×1 IMPLANT
EXTRACTOR VACUUM BELL STYLE (SUCTIONS) IMPLANT
GLOVE BIO SURGEON STRL SZ7 (GLOVE) ×3 IMPLANT
GLOVE BIOGEL PI IND STRL 7.0 (GLOVE) ×1 IMPLANT
GLOVE BIOGEL PI INDICATOR 7.0 (GLOVE) ×2
GOWN STRL REUS W/TWL LRG LVL3 (GOWN DISPOSABLE) ×6 IMPLANT
KIT ABG SYR 3ML LUER SLIP (SYRINGE) IMPLANT
LIQUID BAND (GAUZE/BANDAGES/DRESSINGS) IMPLANT
NEEDLE HYPO 25X5/8 SAFETYGLIDE (NEEDLE) IMPLANT
NS IRRIG 1000ML POUR BTL (IV SOLUTION) ×3 IMPLANT
PACK C SECTION WH (CUSTOM PROCEDURE TRAY) ×3 IMPLANT
PAD OB MATERNITY 4.3X12.25 (PERSONAL CARE ITEMS) ×3 IMPLANT
PENCIL SMOKE EVAC W/HOLSTER (ELECTROSURGICAL) ×6 IMPLANT
RTRCTR C-SECT PINK 25CM LRG (MISCELLANEOUS) ×3 IMPLANT
STRIP CLOSURE SKIN 1/2X4 (GAUZE/BANDAGES/DRESSINGS) ×2 IMPLANT
SUT CHROMIC 0 CTX 36 (SUTURE) IMPLANT
SUT MNCRL AB 3-0 PS2 27 (SUTURE) ×3 IMPLANT
SUT MNCRL AB 4-0 PS2 18 (SUTURE) ×3 IMPLANT
SUT PLAIN 2 0 (SUTURE)
SUT PLAIN 2 0 XLH (SUTURE) ×3 IMPLANT
SUT PLAIN ABS 2-0 54XMFL TIE (SUTURE) IMPLANT
SUT VIC AB 0 CT1 27 (SUTURE) ×4
SUT VIC AB 0 CT1 27XBRD ANBCTR (SUTURE) ×2 IMPLANT
SUT VIC AB 0 CTX 36 (SUTURE) ×6
SUT VIC AB 0 CTX36XBRD ANBCTRL (SUTURE) ×3 IMPLANT
SUT VIC AB 2-0 CT1 27 (SUTURE) ×2
SUT VIC AB 2-0 CT1 TAPERPNT 27 (SUTURE) ×1 IMPLANT
TOWEL OR 17X24 6PK STRL BLUE (TOWEL DISPOSABLE) ×3 IMPLANT
TRAY FOLEY CATH SILVER 14FR (SET/KITS/TRAYS/PACK) IMPLANT
YANKAUER SUCT BULB TIP NO VENT (SUCTIONS) ×3 IMPLANT

## 2014-10-16 NOTE — Brief Op Note (Signed)
10/16/2014  8:57 AM  PATIENT:  [name]  29 y.o. female  PRE-OPERATIVE DIAGNOSIS:  IUP @ 39 1/7 weeks, O34.21 Previous Cesarean Section, Declines TOLAC  POST-OPERATIVE DIAGNOSIS:  Same  PROCEDURE:  Procedure(s): CESAREAN SECTION (N/A), Repeat LTCS 2 layer closure  SURGEON:  Surgeon(s) and Role:    * Gerald Leitz, MD - Assisting    * Geryl Rankins, MD - Primary  PHYSICIAN ASSISTANT:   ASSISTANTS: Technician   ANESTHESIA:   spinal  EBL:  Total I/O In: 2700 [I.V.:2700] Out: 725 [Urine:125; Blood:600]  BLOOD ADMINISTERED:none  DRAINS: Urinary Catheter (Foley)   LOCAL MEDICATIONS USED:  NONE  SPECIMEN:  No Specimen  DISPOSITION OF SPECIMEN:  n/a  COUNTS:  YES  TOURNIQUET:  * No tourniquets in log *  DICTATION: .Other Dictation: Dictation Number [id number]  PLAN OF CARE: Admit to inpatient   PATIENT DISPOSITION:  PACU - hemodynamically stable.   Delay start of Pharmacological VTE agent (>24hrs) due to surgical blood loss or risk of bleeding: yes

## 2014-10-16 NOTE — Anesthesia Postprocedure Evaluation (Signed)
  Anesthesia Post-op Note  Patient: [name]  Procedure(s) Performed: Procedure(s): CESAREAN SECTION (N/A)  Patient Location: PACU  Anesthesia Type:General  Level of Consciousness: awake and alert   Airway and Oxygen Therapy: Patient Spontanous Breathing  Post-op Pain: none  Post-op Assessment: Post-op Vital signs reviewed and Patient's Cardiovascular Status Stable              Post-op Vital Signs: Reviewed and stable  Last Vitals:  Filed Vitals:   10/16/14 1001  BP:   Pulse: 82  Temp: 36.9 C  Resp: 11    Complications: No apparent anesthesia complications

## 2014-10-16 NOTE — Anesthesia Preprocedure Evaluation (Signed)
Anesthesia Evaluation  Patient identified by MRN, date of birth, ID band Patient awake    Reviewed: Allergy & Precautions, NPO status , Patient's Chart, lab work & pertinent test results  Airway Mallampati: II  TM Distance: >3 FB Neck ROM: Full    Dental  (+) Teeth Intact   Pulmonary neg pulmonary ROS,  breath sounds clear to auscultation        Cardiovascular Exercise Tolerance: Good negative cardio ROS  Rhythm:Regular Rate:Normal     Neuro/Psych negative neurological ROS  negative psych ROS   GI/Hepatic negative GI ROS, Neg liver ROS,   Endo/Other  negative endocrine ROS  Renal/GU negative Renal ROS  negative genitourinary   Musculoskeletal negative musculoskeletal ROS (+)   Abdominal   Peds negative pediatric ROS (+)  Hematology negative hematology ROS (+)   Anesthesia Other Findings   Reproductive/Obstetrics negative OB ROS                             Lab Results  Component Value Date   WBC 7.3 10/12/2014   HGB 10.7* 10/12/2014   HCT 31.8* 10/12/2014   MCV 88.6 10/12/2014   PLT 143* 10/12/2014    Anesthesia Physical Anesthesia Plan  ASA: II  Anesthesia Plan: Spinal   Post-op Pain Management:    Induction:   Airway Management Planned: Simple Face Mask and Natural Airway  Additional Equipment:   Intra-op Plan:   Post-operative Plan:   Informed Consent: I have reviewed the patients History and Physical, chart, labs and discussed the procedure including the risks, benefits and alternatives for the proposed anesthesia with the patient or authorized representative who has indicated his/her understanding and acceptance.   Dental advisory given  Plan Discussed with:   Anesthesia Plan Comments:         Anesthesia Quick Evaluation

## 2014-10-16 NOTE — Lactation Note (Signed)
This note was copied from the chart of [name]. Lactation Consultation Note Follow up visit.  [facility] RN assisted with latch in football hold on left breast.  LC observed good deep latch with strong jaw movements and few audible swallows at end of feeding of 12 minutes.  Rolled out lower lip with improved comfort.  Mom nipple slightly compressed at end of feeding.  Hand expressed few drops of colostrum and rubbed into nipples. Pillows given for more support with future feedings and positioning discussed.  FOB at bedside supportive.  Mom was given hand pump by [facility] RN to use prior to latching to help evert nipples.      Patient Name: [name] [name] Date: 10/16/2014 Reason for consult: Follow-up assessment   Maternal Data Has patient been taught Hand Expression?: Yes Does the patient have breastfeeding experience prior to this delivery?: Yes  Feeding Feeding Type: Breast Fed Length of feed: 12 min  LATCH Score/Interventions Latch: Grasps breast easily, tongue down, lips flanged, rhythmical sucking. Intervention(s): Adjust position;Assist with latch;Breast massage;Breast compression  Audible Swallowing: Spontaneous and intermittent Intervention(s): Skin to skin;Hand expression  Type of Nipple: Flat Intervention(s): Hand pump  Comfort (Breast/Nipple): Filling, red/small blisters or bruises, mild/mod discomfort  Problem noted: Mild/Moderate discomfort  Hold (Positioning): Assistance needed to correctly position infant at breast and maintain latch. Intervention(s): Breastfeeding basics reviewed;Support Pillows;Position options;Skin to skin  LATCH Score: 7  Lactation Tools Discussed/Used Initiated by:: RN Date initiated:: 10/16/14   Consult Status Consult Status: Follow-up Date: 10/17/14 Follow-up type: In-patient    Jannifer Rodney 10/16/2014, 4:47 PM

## 2014-10-16 NOTE — Anesthesia Procedure Notes (Signed)
Spinal Patient location during procedure: OR Start time: 10/16/2014 7:25 AM End time: 10/16/2014 7:32 AM Staffing Anesthesiologist: Suella Broad D Performed by: anesthesiologist  Preanesthetic Checklist Completed: patient identified, surgical consent, pre-op evaluation, timeout performed, IV checked, risks and benefits discussed and monitors and equipment checked Spinal Block Patient position: sitting Prep: site prepped and draped and DuraPrep Patient monitoring: heart rate, continuous pulse ox, blood pressure and cardiac monitor Approach: midline Location: L3-4 Injection technique: single-shot Needle Needle type: Pencan  Needle gauge: 24 G Needle length: 9 cm Assessment Sensory level: T4 Additional Notes Negative paresthesia. Negative blood return. Positive free-flowing CSF. Expiration date of kit checked and confirmed. Patient tolerated procedure well, without complications.

## 2014-10-16 NOTE — Addendum Note (Signed)
Addendum  created 10/16/14 1444 by Shanon Payor, CRNA   Modules edited: Notes Section   Notes Section:  File: [id number]

## 2014-10-16 NOTE — Transfer of Care (Signed)
Immediate Anesthesia Transfer of Care Note  Patient: [name]  Procedure(s) Performed: Procedure(s): CESAREAN SECTION (N/A)  Patient Location: PACU  Anesthesia Type:Spinal  Level of Consciousness: awake, alert , oriented and patient cooperative  Airway & Oxygen Therapy: Patient Spontanous Breathing  Post-op Assessment: Report given to RN and Post -op Vital signs reviewed and stable  Post vital signs: Reviewed and stable  Last Vitals:  Filed Vitals:   10/16/14 0930  BP:   Pulse: 87  Temp:   Resp: 21    Complications: No apparent anesthesia complications

## 2014-10-16 NOTE — Interval H&P Note (Signed)
History and Physical Interval Note:  10/16/2014 7:15 AM  [name]  has presented today for surgery, with the diagnosis of O34.21 Previous Cesarean Section  The various methods of treatment have been discussed with the patient and family. After consideration of risks, benefits and other options for treatment, the patient has consented to  Procedure(s): CESAREAN SECTION (N/A) as a surgical intervention .  The patient's history has been reviewed, patient examined, no change in status, stable for surgery.  I have reviewed the patient's chart and labs.  Questions were answered to the patient's satisfaction.     Dion Body, Illya Gienger

## 2014-10-16 NOTE — Lactation Note (Signed)
This note was copied from the chart of [name]. Lactation Consultation Note  Baby sleeping in FOB arms.  Recently breastfed. Discussed achieving depth since she had painful latch w/ first child. Suggest she call with next feeding for assistance.  Discussed basics and cluster feeding. Mom encouraged to feed baby 8-12 times/24 hours and with feeding cues.  Mom made aware of O/P services, breastfeeding support groups, community resources, and our phone # for post-discharge questions.    Patient Name: Raven Alanah Sakuma Today's Date: 10/16/2014 Reason for consult: Initial assessment   Maternal Data Does the patient have breastfeeding experience prior to this delivery?: Yes  Feeding Feeding Type: Breast Fed Length of feed: 5 min  LATCH Score/Interventions Latch: Grasps breast easily, tongue down, lips flanged, rhythmical sucking.  Audible Swallowing: A few with stimulation Intervention(s): Skin to skin;Hand expression Intervention(s): Skin to skin;Hand expression  Type of Nipple: Flat Intervention(s): Hand pump  Comfort (Breast/Nipple): Soft / non-tender     Hold (Positioning): Assistance needed to correctly position infant at breast and maintain latch.  LATCH Score: 7  Lactation Tools Discussed/Used     Consult Status Consult Status: Follow-up Date: 10/17/14 Follow-up type: In-patient    Dahlia Byes [facility] 10/16/2014, 2:57 PM

## 2014-10-16 NOTE — Anesthesia Postprocedure Evaluation (Signed)
  Anesthesia Post-op Note  Patient: [name]  Procedure(s) Performed: Procedure(s): CESAREAN SECTION (N/A)  Patient Location: Mother/Baby  Anesthesia Type:Spinal  Level of Consciousness: awake, alert  and oriented  Airway and Oxygen Therapy: Patient Spontanous Breathing  Post-op Pain: none  Post-op Assessment: Post-op Vital signs reviewed, Patient's Cardiovascular Status Stable, Respiratory Function Stable, No signs of Nausea or vomiting, Adequate PO intake, No headache, No backache and Patient able to bend at knees              Post-op Vital Signs: Reviewed and stable  Last Vitals:  Filed Vitals:   10/16/14 1230  BP: 135/70  Pulse: 80  Temp: 36.8 C  Resp: 18    Complications: No apparent anesthesia complications

## 2014-10-16 NOTE — H&P (Signed)
  History of Present Illness  General:  29 y/o G2 P1001 @ 39 2/7 weeks c/w 7 week ultrasound for preop for repeat cesarean section. Pt declines TOLAC. H/o primary cesarean section due to failure to progress. Pt was induced at 36 weeks due to PPROM. Pt's pregnancy has been uncomplicated with [facility] (Dr. Dion Body). GBS negative, Quad screen negative.   Current Medications  Taking   Diflucan(Fluconazole) 150 MG Tablet 1 tablet Take 1 tablet to start and then repeat with 1 tablet 3 days later   Prenatal 1 Plus 1   Diclegis 10 mg / 10 mg Tablet 2 tablets 2 tablets at bedtime. Increase to 4 tablets daily as directed.   MetroGel-Vaginal(MetroNIDAZOLE) 0.75 % Gel 1 application at bedtime Once a day    Past Medical History  Asmtha  Heart Murmur   Surgical History  C-section 2012   Family History  Father: alive  Mother: alive  Paternal Grand Mother: Uterine cancer, Ovarian cancer  Maternal Grand Mother: osteoporosis, CAD, Hypertension, Diabetes, Heart problems, Alzheimer, diagnosed with Breast Ca  1 son(s) .   Breast, cervical uterine, ovarian cancer.   Social History  General:  Tobacco use  cigarettes: Never smoked Tobacco history last updated 03/08/2014 no Exposed to passive smoke.  Alcohol: yes, social.  no Recreational drug use.  Exercise: yes, intermittent.  Marital Status: single, committed relationship.  Children: Boys, 1.    Gyn History  Sexual activity currently sexually active.  Periods : every month.  Denies H/O LMP 01/15/14.  Birth control none.  Last pap smear date 1 year ago.  Denies H/O Abnormal pap smear.  Denies H/O STD.    OB History  Number of pregnancies 1.  Pregnancy # 1 live birth, C-section delivery.  Pregnancy # 2 Current.    Allergies  Sulfa drugs (for allergy): hives  Iodine: hives   Hospitalization/Major Diagnostic Procedure  childbirth x 1 2012   Review of Systems  Denies fever/chills, chest pain, SOB, headaches, numbness/tingling. No  h/o complication with anesthesia, bleeding disorders or blood clots.   Vital Signs  Wt 184, Wt change 13 lb, BP sitting 112/66.   Physical Examination  GENERAL:  Patient appears alert and oriented.  General Appearance: well-appearing, well-developed, no acute distress.  Speech: clear.  LUNGS:  Auscultation: no wheezing/rhonchi/rales. CTA bilaterally.  HEART:  Heart sounds: normal. RRR. no murmur.  ABDOMEN:  General: soft nontender, nondistended, no masses. Leopolds-Vertex 6.5-7 lbs.  FEMALE GENITOURINARY:  Pelvic cervical exam. Previously closed.  EXTREMITIES:  General: No edema or calf tenderness.     Assessments   1. Encounter for supervision of other normal pregnancy, third trimester - Z34.83 (Primary)   2. H/O cesarean section - Z98.89   3. Pre-operative clearance - Z01.818   Treatment  1. Pre-operative clearance  Notes: Patient was counseled on trial of labor after cesarean section versus repeat cesarean section.. Patient does not except risk of uterine rupture with trial of labor. Patient counseled on risk of adhesion complicated surgery and increasing risk of bladder injury bowel injury, blood transfusion due to excessive bleeding. All questions answered. Patient desires to proceed with repeat cesarean section.    Procedure Codes  16109 POSTOP F U VISIT   Follow Up  2 Weeks post op

## 2014-10-17 ENCOUNTER — Encounter (HOSPITAL_COMMUNITY): Payer: Self-pay | Admitting: Obstetrics and Gynecology

## 2014-10-17 LAB — CBC
HCT: 26.7 % — ABNORMAL LOW (ref 36.0–46.0)
Hemoglobin: 9 g/dL — ABNORMAL LOW (ref 12.0–15.0)
MCH: 30.2 pg (ref 26.0–34.0)
MCHC: 33.7 g/dL (ref 30.0–36.0)
MCV: 89.6 fL (ref 78.0–100.0)
Platelets: 136 10*3/uL — ABNORMAL LOW (ref 150–400)
RBC: 2.98 MIL/uL — ABNORMAL LOW (ref 3.87–5.11)
RDW: 13.7 % (ref 11.5–15.5)
WBC: 7.8 10*3/uL (ref 4.0–10.5)

## 2014-10-17 LAB — BIRTH TISSUE RECOVERY COLLECTION (PLACENTA DONATION)

## 2014-10-17 NOTE — Progress Notes (Signed)
Subjective: Postop Day 1: Cesarean Delivery No complaints.  Pain controlled.  Lochia normal.  Breast feeding yes.  Objective: Temp:  [97.6 F (36.4 C)-98.5 F (36.9 C)] 98.5 F (36.9 C) (09/07 0415) Pulse Rate:  [58-88] 77 (09/07 0415) Resp:  [11-23] 18 (09/07 0415) BP: (102-149)/(57-88) 112/64 mmHg (09/07 0415) SpO2:  [97 %-100 %] 99 % (09/07 0600) Weight:  [83.462 kg (184 lb)] 83.462 kg (184 lb) (09/06 2322)  Physical Exam: Gen: NAD Lochia: Not visualized Uterine Fundus: firm, appropriately tender Incision: dressing clean, dry  DVT Evaluation: Edema present, no calf tenderness bilaterally    Recent Labs  10/17/14 0758  HGB 9.0*  HCT 26.7*    Assessment/Plan: Status post C-section-doing well postoperatively. Routine post op care. Encouraged ambulation. Lactation support. Discharge in am if pt desires.    [name] 10/17/2014, 8:31 AM

## 2014-10-17 NOTE — Op Note (Signed)
[name], [name] NO.:  [id number]  MEDICAL RECORD NO.:  [id number]  LOCATION:  9148                          FACILITY:  [facility]  PHYSICIAN:  Pieter Partridge, MD   DATE OF BIRTH:  Jun 28, 1985  DATE OF PROCEDURE:  10/16/2014 DATE OF DISCHARGE:                              OPERATIVE REPORT   PREOPERATIVE DIAGNOSES:  Intrauterine pregnancy at 63 and 1/7th weeks, history of previous C section, declines trial of labor after cesarean.  POSTOPERATIVE DIAGNOSES:  Intrauterine pregnancy at 62 and 1/7th weeks, history of previous C section, declines trial of labor after cesarean.  PROCEDURE:  Repeat low-transverse cesarean section with 2-layer closure.  SURGEON:  Pieter Partridge, MD  ASSISTANT:  Dr. Su Ley and technician.  ANESTHESIA:  Spinal.  EBL:  600.  DRAINS:  Foley catheter.  LOCAL:  None.  SPECIMENS:  None.  The patient was a cord blood donor.  The patient disposition to PACU hemodynamically stable.  COMPLICATIONS:  None.  FINDINGS:  Viable female infant in the vertex position, 8 pounds and 4 ounces.  Apgars reassuring.  Nuchal cord x1 reduced.  Clear fluid. Normal right tube and ovary.  Left tube and ovary normal upon palpation. The patient had an adhesion on the right-hand side to peritoneum, round ligament suspected.  Serosa shelled off.  PROCEDURE IN DETAIL:  [name] was taken to the operating room with IV running.  She underwent spinal anesthesia without complications. Time-out was taken.  Ancef 2 g IV given.  She was prepped and draped in a normal sterile fashion after her abdomen was marked due to fine incision.  A scalpel was used to incise the incision.  Carried down to the underlying layer of the fascia with the Bovie.  Fascia was incised in the midline and extended laterally with the curved Mayo scissors. Trocars were used to grasp the fascia and the rectus muscles were dissected sharply off the fascia.  Rectus muscles had a  small window, they were separated at the midline.  Peritoneum was identified and entered sharply with the Metzenbaum scissors.  Peritoneum was stretched, it did not look like we had another layer of peritoneum and enter that and at that point, it was difficult to fill up to the fundus of the uterus, so we thought it was another layer of peritoneum, but then realized that it was serosa, but we were unable to put in the Gilberts retractor, so we used a bladder blade for retraction.  The lower uterine segment was incised with a scalpel in a transverse fashion and extended manually.  Head was delivered to the incision, clear fluid noted. Nuchal cord reduced x1.  After head delivered, nose and mouth were suctioned.  Shoulders were delivered.  Delayed cord clamping performed, minimal bleeding at the time of awaiting cord clamp.  Allis clamps were used to clamp the ankles.  Placenta was then removed manually.  Uterus was cleared of all clots and debris and then, the 1st layer of the hysterotomy was closed with 0- Vicryl in a continuous locked fashion.  We were then able to explore and the uterus had contracted, it was nice and firm.  We could see  that it was a thick layer of the serosa that was displaced cephalad.  Initially we did not think that we will be able to close it because it will be under too much tension and there was nothing below the lower uterine segment to attach it to; however, we did a second layer and that seemed to bring it together and so I ran the 3rd layer back incorporating some of the serosa and it was well-approximated and normal anatomy was restored.  Irrigation was performed until clear.  I was able to grasp the peritoneum and close it with 2-0 Vicryl, omentum was herniating through the incision and the malleable was placed to reduce injury. Irrigation was performed.  Fascia was reapproximated with 0-Vicryl in a continuous running fashion.  Subcutaneous space was closed  in 2 layers and skin was reapproximated with 4-0 Monocryl in a subcuticular fashion.  The patient tolerated the procedure well.  Baby was in the OR and nursing at the time of closure.  All instruments, sponge, and needle counts were correct x3.  SCDs were on and operating throughout the entire procedure.     Pieter Partridge, MD     EBV/MEDQ  D:  10/16/2014  T:  10/17/2014  Job:  [id number]

## 2014-10-17 NOTE — Lactation Note (Signed)
This note was copied from the chart of Raven Leatta Alewine. Lactation Consultation Note  Follow consult for sore nipples. Mom was attempting to feed infant in football hold to right breast. Infant was latched by mom and mom was experiencing continuous pain. Infant taken off breast, mom shown to break suction prior to removal. More support pillows added to get infant to level of breast and assisted mom with obtaining a deeper latch and with flanging lower lip. Mom reports that she feels much better and pain has subsided currently. BF basics reviewed. Mom has a hand pump in room and has ordered DEBP through [employer]. Her nipples are both bruised and she has been applying olive oil and has comfort gels also. Encouraged dad to assist with latch and flanging lips. Mom asking questions if baby is getting enough, discussed monitoring for swallows and using breast massage and compression during BF, and monitoring voids/stools. Engorgement prevention discussed.Mom is aware of outpatient services and has handouts with [facility] phone #.  Patient Name: [name] Date: 10/17/2014 Reason for consult: Follow-up assessment   Maternal Data    Feeding Feeding Type: Breast Fed Length of feed: 30 min  LATCH Score/Interventions Latch: Grasps breast easily, tongue down, lips flanged, rhythmical sucking. Intervention(s): Adjust position;Assist with latch;Breast compression  Audible Swallowing: A few with stimulation Intervention(s): Skin to skin;Hand expression  Type of Nipple: Flat  Comfort (Breast/Nipple): Filling, red/small blisters or bruises, mild/mod discomfort  Problem noted: Cracked, bleeding, blisters, bruises Interventions  (Cracked/bleeding/bruising/blister):  (Comfort shells and olive oil topically)  Hold (Positioning): Assistance needed to correctly position infant at breast and maintain latch. Intervention(s): Breastfeeding basics reviewed;Support Pillows;Position  options;Skin to skin  LATCH Score: 6  Lactation Tools Discussed/Used     Consult Status Consult Status: Follow-up Date: 10/18/14 Follow-up type: In-patient    [name] 10/17/2014, 4:42 PM

## 2014-10-18 MED ORDER — OXYCODONE-ACETAMINOPHEN 5-325 MG PO TABS: 1.0000 | ORAL_TABLET | Freq: Four times a day (QID) | ORAL | Status: AC | PRN

## 2014-10-18 MED ORDER — DOCUSATE SODIUM 100 MG PO CAPS: 100.0000 mg | ORAL_CAPSULE | Freq: Two times a day (BID) | ORAL | Status: AC

## 2014-10-18 MED ORDER — IBUPROFEN 600 MG PO TABS: 600.0000 mg | ORAL_TABLET | Freq: Four times a day (QID) | ORAL | Status: AC

## 2014-10-18 NOTE — Discharge Summary (Signed)
OB Discharge Summary  Patient Name: [name] DOB: 1985-06-08 MRN: [id number]  Date of admission: 10/16/2014 Delivering MD:     Date of discharge:10/18/2014  Admitting diagnosis: Scheduled Cesarean section   Intrauterine pregnancy: [redacted]w[redacted]d     Secondary diagnosis: None     Discharge diagnosis: Term Pregnancy Delivered                                                                                            Post partum procedures:none  Augmentation: N/A  Complications:None  Hospital course:  Sceduled C/S   29 y.o. yo G2P1102 at [redacted]w[redacted]d was admitted to the hospital 10/16/2014 for scheduled cesarean section with the following indication:Elective Repeat.  Patient delivered a Viable infant.10/16/2014- Delivery performed by Dr. Dion Body.  Details of operation can be found in separate operative note.  Pateint had an uncomplicated postpartum course.  She is ambulating, tolerating a regular diet, passing flatus, and urinating well. Patient is discharged home in stable condition on 10/18/2014 12:30 PM          Physical exam  Filed Vitals:   10/17/14 0930 10/17/14 1818 10/18/14 0629 10/18/14 1036  BP: 116/63 119/59 115/62 116/66  Pulse: 70 72 70 74  Temp: 97.3 F (36.3 C) 97.9 F (36.6 C) 98 F (36.7 C)   TempSrc: Oral Oral    Resp: Height:      Weight:      SpO2: 98%  100%    General: alert, cooperative and no distress Lochia: appropriate Uterine Fundus: firm, non-tender, below umbilicus Incision: Dressing is clean, dry, and intact DVT Evaluation: No evidence of DVT seen on physical exam. Labs: Lab Results  Component Value Date   WBC 7.8 10/17/2014   HGB 9.0* 10/17/2014   HCT 26.7* 10/17/2014   MCV 89.6 10/17/2014   PLT 136* 10/17/2014   CMP Latest Ref Rng 03/18/2010  Glucose 70 - 99 mg/dL 562(Z)  BUN 6 - 23 mg/dL 4(L)  Creatinine 0.4 - 1.2 mg/dL 3.08  Sodium 657 - 846 mEq/L 132(L)  Potassium 3.5 - 5.1 mEq/L 4.0  Chloride 96 - 112 mEq/L 102  CO2 19 - 32  mEq/L 22  Calcium 8.4 - 10.5 mg/dL 8.2(L)    Discharge instruction: per After Visit Summary and "Baby and Me Booklet".  Medications:   Medication List    TAKE these medications        calcium carbonate 500 MG chewable tablet  Commonly known as:  TUMS - dosed in mg elemental calcium  Chew 2 tablets by mouth daily.     docusate sodium 100 MG capsule  Commonly known as:  COLACE  Take 1 capsule (100 mg total) by mouth 2 (two) times daily.     ibuprofen 600 MG tablet  Commonly known as:  ADVIL,MOTRIN  Take 1 tablet (600 mg total) by mouth every 6 (six) hours.     oxyCODONE-acetaminophen 5-325 MG per tablet  Commonly known as:  PERCOCET/ROXICET  Take 1 tablet by mouth every 6 (six) hours as needed for moderate pain (for pain scale 4-7).     prenatal multivitamin  Tabs tablet  Take 2 tablets by mouth daily at 12 noon.        Diet: routine diet  Activity: Advance as tolerated. Pelvic rest for 6 weeks.   Outpatient follow up:2 weeks  Postpartum contraception: Undecided  Newborn Data: Live born female  Birth Weight: 8 lb 4.3 oz (3751 g) APGAR: 8, 9  Baby Feeding: Breast Disposition:home with mother   10/18/2014 Myna Hidalgo, M, DO

## 2014-10-18 NOTE — Lactation Note (Signed)
This note was copied from the chart of Raven Seaira Byus. Lactation Consultation Note; Mom reports baby fed a lot through the night. Reassurance given.Reports nipples are feeling better today. Using olive oil and comfort gels on them. Observed latch mom doing good job getting deeper latch. Nipples pink and raw on tips. Baby only nursed for 10 min- sleepy. MOm reports breast feels softer. Reviewed engorgement prevention and treatment. Reviewed OP appointments and BFSG as resources for support after DC. No questions at present. To call prn  Patient Name: [name] [id number] Date: 10/18/2014 Reason for consult: Follow-up assessment   Maternal Data Formula Feeding for Exclusion: No Has patient been taught Hand Expression?: Yes Does the patient have breastfeeding experience prior to this delivery?: Yes  Feeding Feeding Type: Breast Fed Length of feed: 10 min  LATCH Score/Interventions Latch: Grasps breast easily, tongue down, lips flanged, rhythmical sucking.  Audible Swallowing: A few with stimulation  Type of Nipple: Everted at rest and after stimulation  Comfort (Breast/Nipple): Filling, red/small blisters or bruises, mild/mod discomfort  Problem noted: Mild/Moderate discomfort;Filling Interventions (Mild/moderate discomfort): Comfort gels  Hold (Positioning): Assistance needed to correctly position infant at breast and maintain latch. Intervention(s): Breastfeeding basics reviewed  LATCH Score: 7  Lactation Tools Discussed/Used WIC Program: No   Consult Status Consult Status: Complete    Pamelia Hoit 10/18/2014, 10:21 AM

## 2014-10-18 NOTE — Progress Notes (Signed)
Subjective: Postop Day 2: Cesarean Delivery No acute complaints, resting comfortably.  Pain well controlled.  Lochia normal.  Cluster breast feeding- pt to see lactation today.  No F/C/CP/SOB.  +flatus, +BM.  Tolerating general diet.  Objective: Temp:  [97.3 F (36.3 C)-98 F (36.7 C)] 98 F (36.7 C) (09/08 0629) Pulse Rate:  [70-72] 70 (09/08 0629) Resp:  [18] 18 (09/08 0629) BP: (115-119)/(59-63) 115/62 mmHg (09/08 0629) SpO2:  [98 %-100 %] 100 % (09/08 0629)  Physical Exam: Gen: NAD CV: RRR Lungs: CTAB Uterine Fundus: firm, non-tender, below umbilcus Incision: dressing clean and dry DVT Evaluation: edema improved, no calf tenderness bilaterally  CBC Latest Ref Rng 10/17/2014 10/12/2014 02/26/2014  WBC 4.0 - 10.5 K/uL 7.8 7.3 8.9  Hemoglobin 12.0 - 15.0 g/dL 9.0(L) 10.7(L) 13.5  Hematocrit 36.0 - 46.0 % 26.7(L) 31.8(L) 39.0  Platelets 150 - 400 K/uL 136(L) 143(L) 231     Assessment/Plan: 29yo [id number] s/p repeat C-section, POD#2  Status post C-section-doing well postoperatively. Routine post op care. Encouraged ambulation. Lactation support. Plan for discharge home today    Myna Hidalgo, M 10/18/2014, 7:14 AM

## 2014-10-18 NOTE — Discharge Instructions (Signed)
Cesarean Delivery, Care After Refer to this sheet in the next few weeks. These instructions provide you with information on caring for yourself after your procedure. Your health care provider may also give you specific instructions. Your treatment has been planned according to current medical practices, but problems sometimes occur. Call your health care provider if you have any problems or questions after you go home. HOME CARE INSTRUCTIONS  Only take over-the-counter or prescription medications as directed by your health care provider.  For pain management please alternate between percocet and motrin.  Percocet may cause constipation so be sure to take the stool softener (colace) as needed.  Do not drink alcohol, especially if you are breastfeeding or taking medication to relieve pain.  Do not chew or smoke tobacco.  Continue to use good perineal care. Good perineal care includes:  Wiping your perineum from front to back.  Keeping your perineum clean.  Check your surgical cut (incision) daily for increased redness, drainage, swelling, or separation of skin.  Clean your incision gently with soap and water every day, and then pat it dry. If your health care provider says it is okay, leave the incision uncovered. Use a bandage (dressing) if the incision is draining fluid or appears irritated. If the adhesive strips across the incision do not fall off within 7 days, carefully peel them off.  Hug a pillow when coughing or sneezing until your incision is healed. This helps to relieve pain.  Do not use tampons or douche until your health care provider says it is okay.  Shower, wash your hair, and take tub baths as directed by your health care provider.  Wear a well-fitting bra that provides breast support.  Limit wearing support panties or control-top hose.  Drink enough fluids to keep your urine clear or pale yellow.  Eat high-fiber foods such as whole grain cereals and breads, brown  rice, beans, and fresh fruits and vegetables every day. These foods may help prevent or relieve constipation.  Resume activities such as climbing stairs, driving, lifting, exercising, or traveling as directed by your health care provider.  Talk to your health care provider about resuming sexual activities. This is dependent upon your risk of infection, your rate of healing, and your comfort and desire to resume sexual activity.  Try to have someone help you with your household activities and your newborn for at least a few days after you leave the hospital.  Rest as much as possible. Try to rest or take a nap when your newborn is sleeping.  Increase your activities gradually.  Keep all of your scheduled postpartum appointments. It is very important to keep your scheduled follow-up appointments. At these appointments, your health care provider will be checking to make sure that you are healing physically and emotionally. SEEK MEDICAL CARE IF:   You are passing large clots from your vagina. Save any clots to show your health care provider.  You have a foul smelling discharge from your vagina.  You have trouble urinating.  You are urinating frequently.  You have pain when you urinate.  You have a change in your bowel movements.  You have increasing redness, pain, or swelling near your incision.  You have pus draining from your incision.  Your incision is separating.  You have painful, hard, or reddened breasts.  You have a severe headache.  You have blurred vision or see spots.  You feel sad or depressed.  You have thoughts of hurting yourself or your newborn.  You have questions about your care, the care of your newborn, or medications.  You are dizzy or light-headed.  You have a rash.  You have pain, redness, or swelling at the site of the removed intravenous access (IV) tube.  You have nausea or vomiting.  You stopped breastfeeding and have not had a menstrual  period within 12 weeks of stopping.  You are not breastfeeding and have not had a menstrual period within 12 weeks of delivery.  You have a fever. SEEK IMMEDIATE MEDICAL CARE IF:  You have persistent pain.  You have chest pain.  You have shortness of breath.  You faint.  You have leg pain.  You have stomach pain.  Your vaginal bleeding saturates 2 or more sanitary pads in 1 hour. MAKE SURE YOU:   Understand these instructions.  Will watch your condition.  Will get help right away if you are not doing well or get worse. Document Released: 10/18/2001 Document Revised: 06/12/2013 Document Reviewed: 09/23/2011 [facility] Patient Information 2015 [place], [place]. This information is not intended to replace advice given to you by your health care provider. Make sure you discuss any questions you have with your health care provider.

## 2014-11-26 ENCOUNTER — Other Ambulatory Visit (HOSPITAL_COMMUNITY)
Admission: RE | Admit: 2014-11-26 | Discharge: 2014-11-26 | Disposition: A | Discharge status: Home / Self Care | Payer: 59 | Source: Ambulatory Visit | Attending: Obstetrics and Gynecology | Admitting: Obstetrics and Gynecology

## 2014-11-26 ENCOUNTER — Other Ambulatory Visit: Payer: Self-pay | Admitting: Obstetrics and Gynecology

## 2014-11-26 DIAGNOSIS — Z1151 Encounter for screening for human papillomavirus (HPV): Secondary | ICD-10-CM | POA: Insufficient documentation

## 2014-11-26 DIAGNOSIS — Z01411 Encounter for gynecological examination (general) (routine) with abnormal findings: Secondary | ICD-10-CM | POA: Insufficient documentation

## 2014-11-27 LAB — CYTOLOGY - PAP

## 2014-12-11 ENCOUNTER — Other Ambulatory Visit: Payer: Self-pay | Admitting: Obstetrics and Gynecology

## 2015-03-19 ENCOUNTER — Other Ambulatory Visit: Payer: Self-pay | Admitting: Obstetrics and Gynecology

## 2016-01-28 IMAGING — US US OB COMP LESS 14 WK
1 series · 14 of 28 positions shown · non-contrast
Comparison: None.

CLINICAL DATA: Bleeding and cramping

EXAM:
OBSTETRIC <14 WK US AND TRANSVAGINAL OB US
TECHNIQUE: Both transabdominal and transvaginal ultrasound examinations were
performed for complete evaluation of the gestation as well as the
maternal uterus, adnexal regions, and pelvic cul-de-sac.
Transvaginal technique was performed to assess early pregnancy.

[Series 1: us ob comp less 14 wks · 14 of 57 slices shown]
[im 3/57]
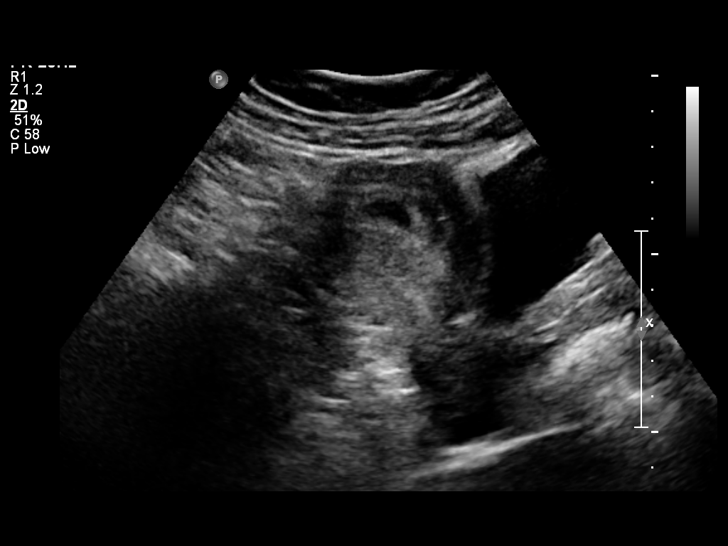
[im 7/57]
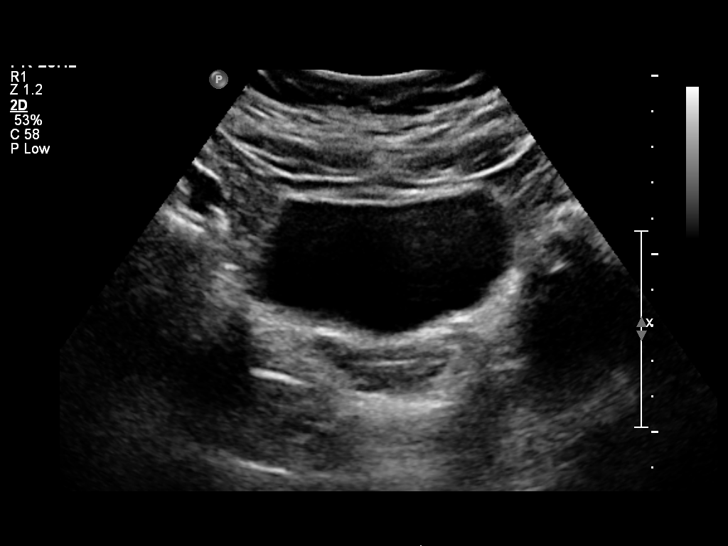
[im 11/57]
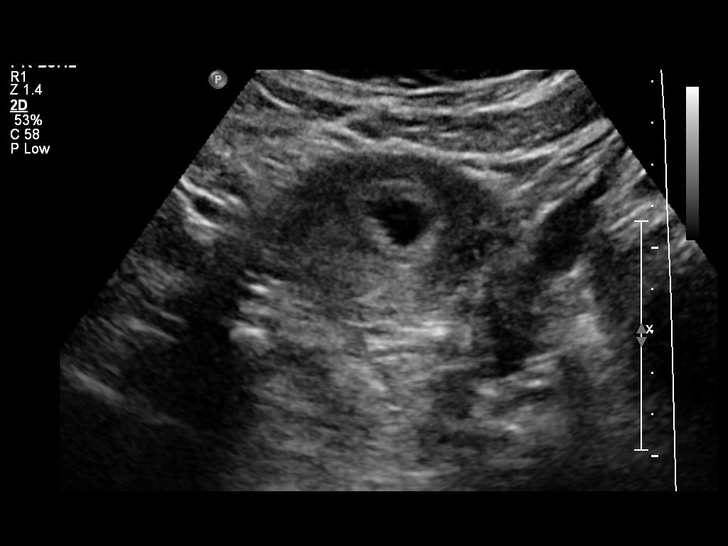
[im 15/57]
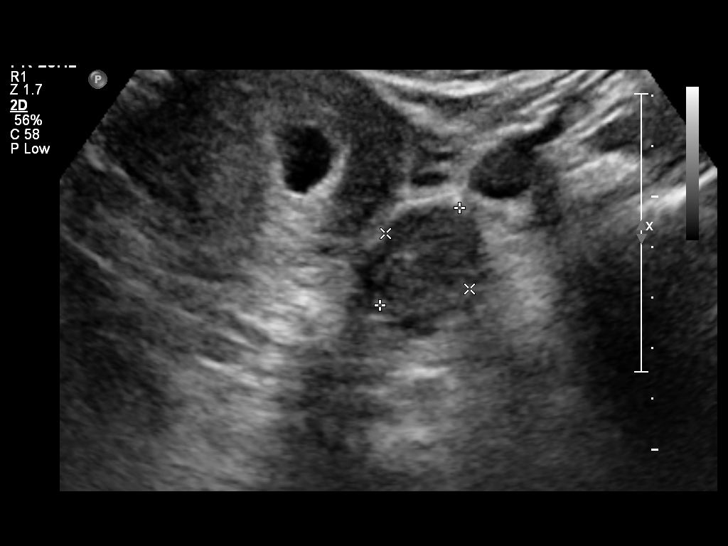
[im 19/57]
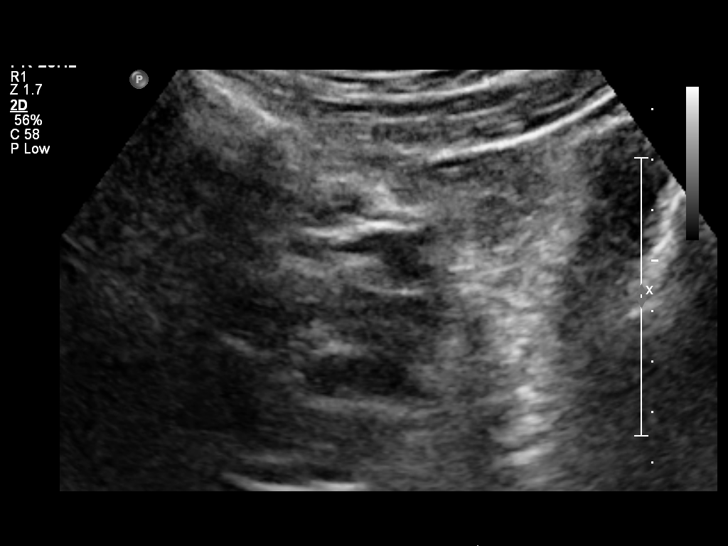
[im 23/57]
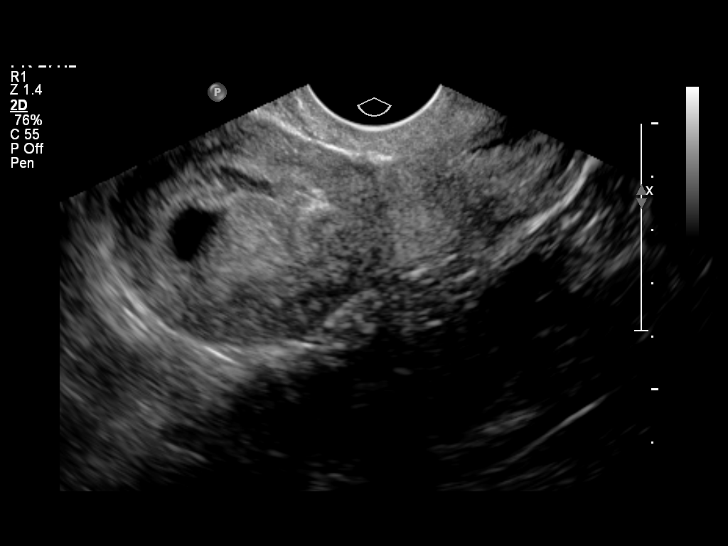
[im 27/57]
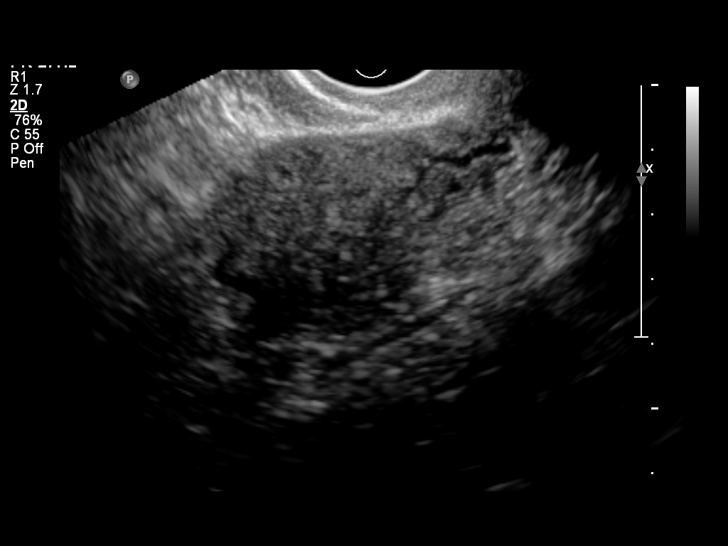
[im 32/57]
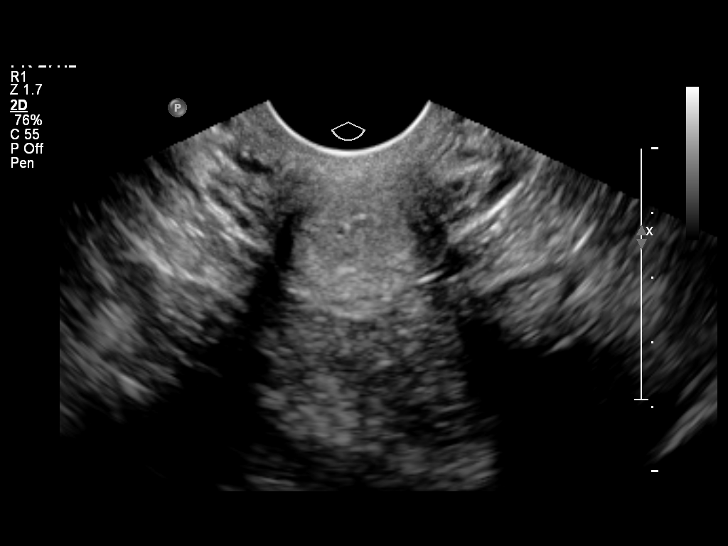
[im 36/57]
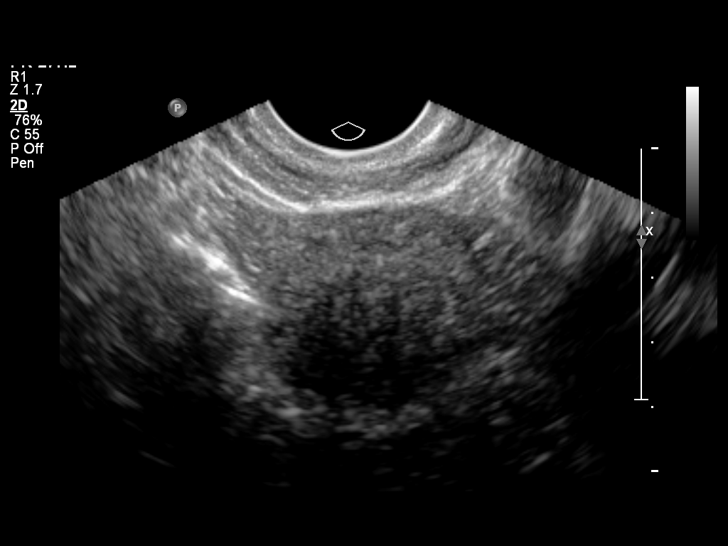
[im 40/57]
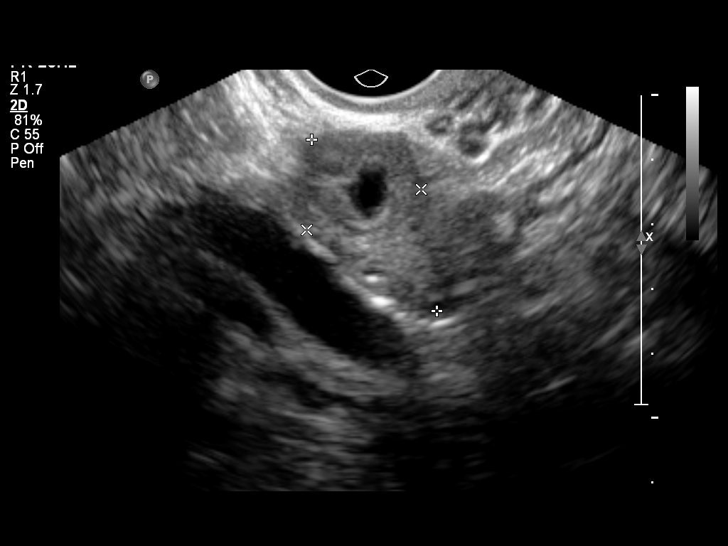
[im 44/57]
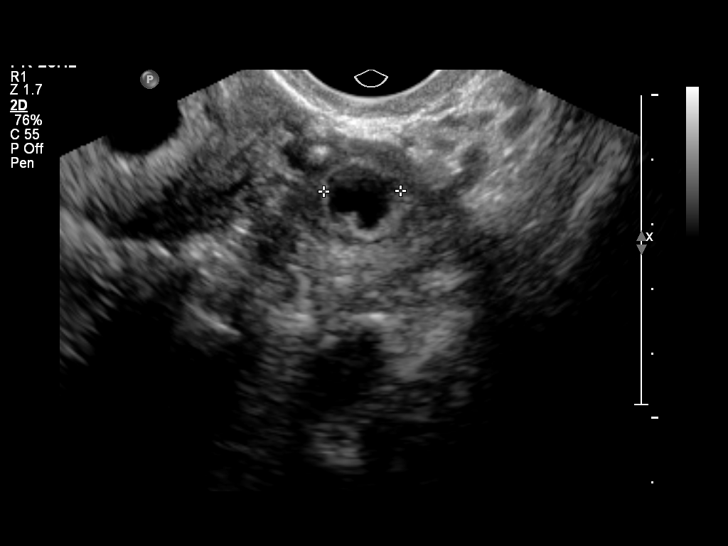
[im 48/57]
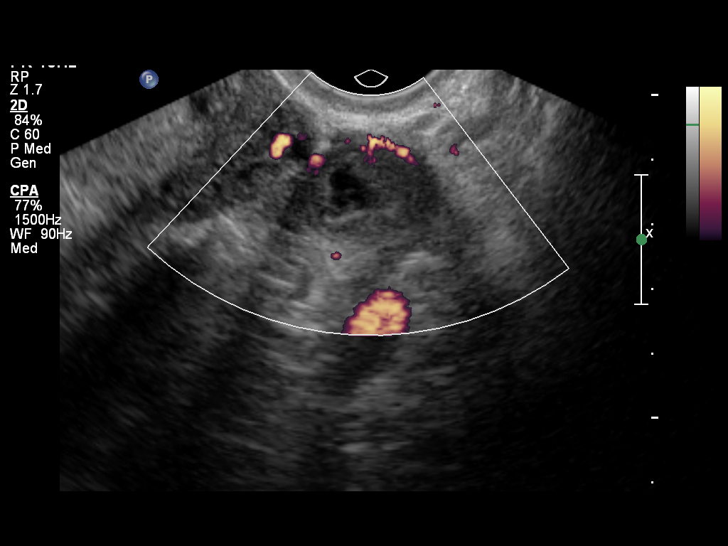
[im 52/57]
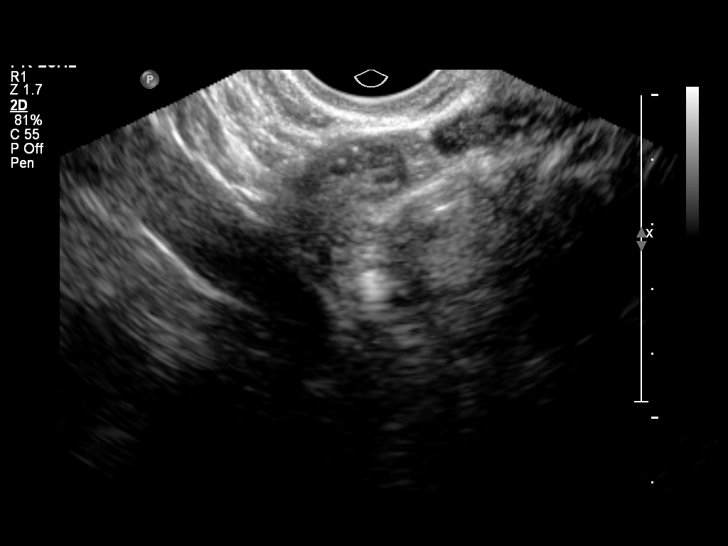
[im 57/57]
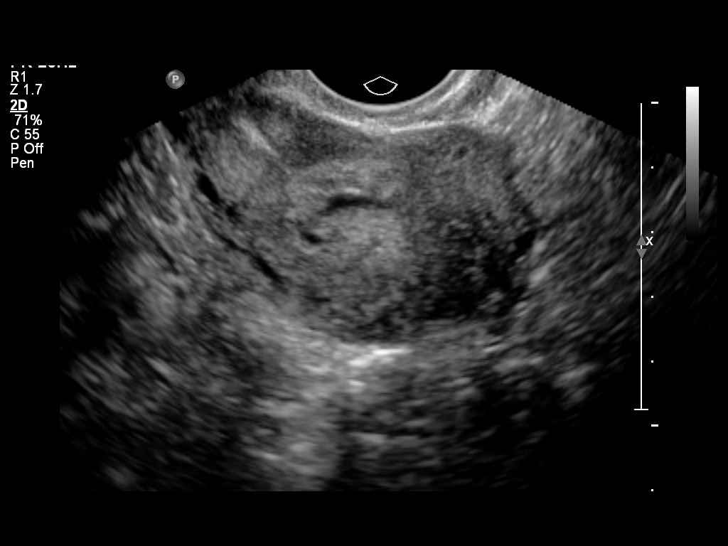

[14 of 28 positions shown; findings below may reference images not displayed]

FINDINGS: Intrauterine gestational sac: Single

Yolk sac:  Yes

Embryo:  Yes

Cardiac Activity: No

Heart Rate:  Not visualized bpm

CRL:   3.2  mm   6 w 0 d                  US EDC: 10/22/2014

Maternal uterus/adnexae:

Subchorionic hemorrhage: Small

Right ovary: Normal

Left ovary: Normal

Other :None

Free fluid:  None
IMPRESSION: 1. Single intrauterine gestational sac containing an embryo and yolk
sac. No cardiac activity identified within the embryo. Findings are
suspicious but not yet definitive for failed pregnancy. Recommend
follow-up US in 10-14 days for definitive diagnosis. This
recommendation follows SRU consensus guidelines: Diagnostic Criteria
for Nonviable Pregnancy Early in the First Trimester. N Engl J Med

## 2020-02-12 ENCOUNTER — Emergency Department (HOSPITAL_COMMUNITY)
Admission: EM | Admit: 2020-02-12 | Discharge: 2020-02-12 | Disposition: A | Discharge status: Home / Self Care | Payer: Managed Care, Other (non HMO) | Attending: Emergency Medicine | Admitting: Emergency Medicine

## 2020-02-12 ENCOUNTER — Other Ambulatory Visit: Payer: Self-pay

## 2020-02-12 ENCOUNTER — Encounter (HOSPITAL_COMMUNITY): Payer: Self-pay

## 2020-02-12 DIAGNOSIS — R1010 Upper abdominal pain, unspecified: Secondary | ICD-10-CM | POA: Insufficient documentation

## 2020-02-12 DIAGNOSIS — Z5321 Procedure and treatment not carried out due to patient leaving prior to being seen by health care provider: Secondary | ICD-10-CM | POA: Insufficient documentation

## 2020-02-12 LAB — COMPREHENSIVE METABOLIC PANEL
ALT: 19 U/L (ref 0–44)
AST: 19 U/L (ref 15–41)
Albumin: 4.5 g/dL (ref 3.5–5.0)
Alkaline Phosphatase: 49 U/L (ref 38–126)
Anion gap: 11 (ref 5–15)
BUN: 14 mg/dL (ref 6–20)
CO2: 23 mmol/L (ref 22–32)
Calcium: 9.4 mg/dL (ref 8.9–10.3)
Chloride: 104 mmol/L (ref 98–111)
Creatinine, Ser: 0.7 mg/dL (ref 0.44–1.00)
GFR, Estimated: >60 mL/min (ref >60)
Glucose, Bld: 92 mg/dL (ref 70–99)
Potassium: 4 mmol/L (ref 3.5–5.1)
Sodium: 138 mmol/L (ref 135–145)
Total Bilirubin: 0.4 mg/dL (ref 0.3–1.2)
Total Protein: 7.2 g/dL (ref 6.5–8.1)

## 2020-02-12 LAB — URINALYSIS, ROUTINE W REFLEX MICROSCOPIC
Bilirubin Urine: NEGATIVE
Glucose, UA: NEGATIVE mg/dL
Hgb urine dipstick: NEGATIVE
Ketones, ur: NEGATIVE mg/dL
Leukocytes,Ua: NEGATIVE
Nitrite: NEGATIVE
Protein, ur: NEGATIVE mg/dL
Specific Gravity, Urine: 1.01 (ref 1.005–1.030)
pH: 5.5 (ref 5.0–8.0)

## 2020-02-12 LAB — LIPASE, BLOOD: Lipase: 24 U/L (ref 11–51)

## 2020-02-12 LAB — CBC
HCT: 42.8 % (ref 36.0–46.0)
Hemoglobin: 14.7 g/dL (ref 12.0–15.0)
MCH: 30.8 pg (ref 26.0–34.0)
MCHC: 34.3 g/dL (ref 30.0–36.0)
MCV: 89.7 fL (ref 80.0–100.0)
Platelets: 238 10*3/uL (ref 150–400)
RBC: 4.77 MIL/uL (ref 3.87–5.11)
RDW: 12 % (ref 11.5–15.5)
WBC: 7.1 10*3/uL (ref 4.0–10.5)
nRBC: 0 % (ref 0.0–0.2)

## 2020-02-12 LAB — I-STAT BETA HCG BLOOD, ED (MC, WL, AP ONLY): I-stat hCG, quantitative: <5 m[IU]/mL (ref <5)

## 2020-02-12 NOTE — ED Triage Notes (Signed)
Pt presents with c/o abdominal pain since last night. Pt reports that the the pain started last night, right in the center of her upper abdomen, after she had a bowel movement. Pt called her PCP and was told to come to the ER.

## 2020-02-12 NOTE — ED Notes (Signed)
Per staff, pt stated she was leaving

## 2022-12-07 ENCOUNTER — Other Ambulatory Visit: Payer: Self-pay | Admitting: Body Imaging

## 2022-12-08 LAB — SURGICAL PATHOLOGY
